# Patient Record
Sex: Male | Born: 2000 | Race: White | Hispanic: No | Marital: Married | Smoking: Never smoker
Health system: Southern US, Community
[De-identification: ages and names within clinical notes are randomized; demographics above are authoritative.]

## PROBLEM LIST (undated history)

## (undated) VITALS — BP 102/57 | HR 58 | Temp 97.6°F | Resp 18 | Ht 65.16 in | Wt 205.0 lb

## (undated) DIAGNOSIS — J45909 Unspecified asthma, uncomplicated: Secondary | ICD-10-CM

## (undated) DIAGNOSIS — F32A Depression, unspecified: Secondary | ICD-10-CM

## (undated) DIAGNOSIS — F419 Anxiety disorder, unspecified: Secondary | ICD-10-CM

## (undated) DIAGNOSIS — F909 Attention-deficit hyperactivity disorder, unspecified type: Secondary | ICD-10-CM

## (undated) DIAGNOSIS — F39 Unspecified mood [affective] disorder: Secondary | ICD-10-CM

## (undated) DIAGNOSIS — F329 Major depressive disorder, single episode, unspecified: Secondary | ICD-10-CM

## (undated) HISTORY — PX: OTHER SURGICAL HISTORY: SHX169

---
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
## 2019-05-02 ENCOUNTER — Ambulatory Visit (HOSPITAL_COMMUNITY)
Admission: RE | Admit: 2019-05-02 | Discharge: 2019-05-02 | Disposition: A | Discharge status: Home / Self Care | Payer: Medicaid Other | Attending: Psychiatry | Admitting: Psychiatry

## 2019-05-02 DIAGNOSIS — F909 Attention-deficit hyperactivity disorder, unspecified type: Secondary | ICD-10-CM | POA: Diagnosis not present

## 2019-05-02 DIAGNOSIS — F332 Major depressive disorder, recurrent severe without psychotic features: Secondary | ICD-10-CM | POA: Diagnosis not present

## 2019-05-02 DIAGNOSIS — F419 Anxiety disorder, unspecified: Secondary | ICD-10-CM | POA: Diagnosis not present

## 2019-05-02 DIAGNOSIS — J45909 Unspecified asthma, uncomplicated: Secondary | ICD-10-CM | POA: Insufficient documentation

## 2019-05-02 DIAGNOSIS — Z888 Allergy status to other drugs, medicaments and biological substances status: Secondary | ICD-10-CM | POA: Diagnosis not present

## 2019-05-02 NOTE — BH Assessment (Signed)
Assessment Note  Adrian Meyer is an 18 y.o. male [facility] after self-report he called the police reporting he needed mental health services. Patient report he feelings depressed triggered by a recent breakup with his girlfriend of 343-months. Patient denied suicidal ideations but report he has had passive thoughts, denied intent. Denied homicidal ideations and denied auditory / visual hallucinations. Report he wants to someone to talk to about his depression. Patient age-out of the program and therapy services he was receiving. Patient denies psychiatrist or OPT treatment, expressing he wishes to be connected. Report smoking marijuana and viping.  Patient affect was depressed and anxiety symptoms present as patient rocked back and forth during assessment. Patient report he lives with family in the area and he has someone he can talk to. Patient report history of depression, Bi-polar and anxiety. [facility] Patient denied additional hospitalizations since he being released. Report decreased sleep and appetite due to depression.   Disposition:   Denzil MagnusonLashunda Thomas, NP, patient psych-cleared. Does not meet inpatient criteria at this time.       Diagnosis:  F33.2   Major depressive disorder, Recurrent episode, Severe  Past Medical History:  Past Medical History:  Diagnosis Date  . ADHD (attention deficit hyperactivity disorder)   . Anxiety   . Asthma   . Depression   . Mood disorder [facility](HCC)     Past Surgical History:  Procedure Laterality Date  . wart removal      Family History: No family history on file.  Social History:  reports that he has never smoked. His smokeless tobacco use includes chew. He reports current drug use. Frequency: 4.00 times per week. Drug: Marijuana. He reports that he does not drink alcohol.  Additional Social History:  Alcohol / Drug Use Pain Medications: see MAR Prescriptions: see MAR Over the Counter: see  MAR History of alcohol / drug use?: Yes Substance #1 Name of Substance 1: THC 1 - Amount (size/oz): varies 1 - Frequency: daily 1 - Last Use / Amount: 05/01/2019  CIWA: CIWA-Ar BP: 122/69 Pulse Rate: 65 COWS:    Allergies:  Allergies  Allergen Reactions  . Depakote [Valproic Acid] Nausea Only    Pt reports Depakote makes him sick    Home Medications: (Not in a hospital admission)   OB/GYN Status:  No LMP for male patient.  General Assessment Data [facility] of Assessment: [facility] Services(walk-in) TTS Assessment: In system Is this a Tele or Face-to-Face Assessment?: Face-to-Face Is this an Initial Assessment or a Re-assessment for this encounter?: Initial Assessment Patient Accompanied by:: N/A(alone) Language Other than English: No Living Arrangements: Other (Comment)(report lives with family members ) What gender do you identify as?: Male Marital status: Single Maiden name: n/a Pregnancy Status: No Living Arrangements: Other relatives(report lives with family members ) Can pt return to current living arrangement?: Yes Admission Status: Voluntary Is patient capable of signing voluntary admission?: Yes Referral Source: Self/Family/Friend Insurance type: self-pay   Medical Screening Exam [facility] Walk-in ONLY) Medical Exam completed: Yes  Crisis Care Plan Living Arrangements: Other relatives(report lives with family members ) Legal Guardian: Other:(self ) Name of Psychiatrist: none report  Name of Therapist: none report   Education Status Is patient currently in school?: No Is the patient employed, unemployed or receiving disability?: Unemployed  Risk to self with the past 6 months Suicidal Ideation: No(currently denied SI, report passive thoughts ) Has patient been a risk to self within the past 6 months  prior to admission? : No Suicidal Intent: No Has patient had any suicidal intent within the past 6 months prior to admission? : No Is patient at risk for  suicide?: No Suicidal Plan?: No Has patient had any suicidal plan within the past 6 months prior to admission? : No Access to Means: No What has been your use of drugs/alcohol within the last 12 months?: THC  Previous Attempts/Gestures: No How many times?: 0 Other Self Harm Risks: none report  Triggers for Past Attempts: None known Intentional Self Injurious Behavior: None(None report ) Family Suicide History: No Recent stressful life event(s): Other (Comment)(recent breakup with girfriend ) Persecutory voices/beliefs?: No Depression: Yes Depression Symptoms: Feeling worthless/self pity Substance abuse history and/or treatment for substance abuse?: No Suicide prevention information given to non-admitted patients: Not applicable  Risk to Others within the past 6 months Homicidal Ideation: No Does patient have any lifetime risk of violence toward others beyond the six months prior to admission? : No Thoughts of Harm to Others: No Current Homicidal Intent: No Current Homicidal Plan: No Access to Homicidal Means: No Identified Victim: n/a History of harm to others?: No Assessment of Violence: None Noted Violent Behavior Description: None Noted  Does patient have access to weapons?: No Criminal Charges Pending?: No Does patient have a court date: No Is patient on probation?: No  Psychosis Hallucinations: None noted Delusions: None noted  Mental Status Report Appearance/Hygiene: Other (Comment)(appropriate for weather ) Eye Contact: Fair Motor Activity: Freedom of movement Speech: Logical/coherent Level of Consciousness: Alert Mood: Depressed Affect: Depressed Anxiety Level: None Thought Processes: Coherent, Relevant Judgement: Unimpaired Orientation: Person, Place, Time, Situation Obsessive Compulsive Thoughts/Behaviors: None  Cognitive Functioning Concentration: Good Memory: Recent Intact, Remote Intact Is patient IDD: No Insight: Fair Impulse Control:  Fair Appetite: Poor Have you had any weight changes? : No Change Sleep: Decreased Total Hours of Sleep: 6(report normally sleeps more than 6-hours ) Vegetative Symptoms: None  ADLScreening [facility] Assessment Services) Patient's cognitive ability adequate to safely complete daily activities?: Yes Patient able to express need for assistance with ADLs?: Yes Independently performs ADLs?: Yes (appropriate for developmental age)  Prior Inpatient Therapy Prior Inpatient Therapy: Yes Prior Therapy Dates: 08/2016&02/2017 [facility]  Reason for Treatment: mental health   Prior Outpatient Therapy Prior Outpatient Therapy: No Does patient have an ACCT team?: No Does patient have Intensive In-House Services?  : No Does patient have [facility] services? : No Does patient have P4CC services?: No  ADL Screening (condition at time of admission) Patient's cognitive ability adequate to safely complete daily activities?: Yes Is the patient deaf or have difficulty hearing?: No Does the patient have difficulty seeing, even when wearing glasses/contacts?: No Does the patient have difficulty concentrating, remembering, or making decisions?: No Patient able to express need for assistance with ADLs?: Yes Does the patient have difficulty dressing or bathing?: No Independently performs ADLs?: Yes (appropriate for developmental age) Does the patient have difficulty walking or climbing stairs?: No       Abuse/Neglect Assessment (Assessment to be complete while patient is alone) Abuse/Neglect Assessment Can Be Completed: Yes Physical Abuse: Denies Verbal Abuse: Denies Sexual Abuse: Denies Exploitation of patient/patient's resources: Denies Self-Neglect: Denies     Merchant navy officerAdvance Directives (For Healthcare) Does Patient Have a Medical Advance Directive?: No Would patient like information on creating a medical advance directive?: No - Patient declined           Disposition:  Disposition Initial Assessment Completed for  this Encounter: Kathrene Bongo, NP, patient psych-cleared ) Disposition of Patient: Discharge(Lashunda Marcello Moores, NP, pt psych-cleared )  On Site Evaluation by:   Reviewed with Physician:    Despina Hidden 05/02/2019 2:31 PM

## 2019-05-02 NOTE — H&P (Signed)
Behavioral Health Medical Screening Exam  Adrian Meyer is an 18 y.o. male.who presented to [facility] as a walk-in, voluntarily. He states that he has been feeling depressed lately due to a recent break-up with his girlfriend. He denies suicidal thoughts with plan or intent, homicidal ideas, or psychosis. He has prior psychiatric hospitalizations although int he distant past. He denies recent self harming behaviors. He is currently on medications to manage diagnoses of Bipolar, depression although reports he has no current therapist or psychiatrist. He reports he would like to get established with both. He reports smoking marijuana and vaping. He denies other substance abuse or use.    Total Time spent with patient: 15 minutes  Psychiatric Specialty Exam: Physical Exam  Vitals reviewed. Constitutional: He is oriented to person, place, and time.  Neurological: He is alert and oriented to person, place, and time.    Review of Systems  Psychiatric/Behavioral: Positive for depression. Negative for hallucinations, memory loss and suicidal ideas. Substance abuse: marijuana use  The patient is not nervous/anxious and does not have insomnia.   All other systems reviewed and are negative.   There were no vitals taken for this visit.There is no height or weight on file to calculate BMI.  General Appearance: Fairly Groomed  Eye Contact:  Good  Speech:  Clear and Coherent and Slow  Volume:  Decreased  Mood:  Depressed  Affect:  Congruent  Thought Process:  Coherent, Goal Directed, Linear and Descriptions of Associations: Intact  Orientation:  Full (Time, Place, and Person)  Thought Content:  Logical  Suicidal Thoughts:  No  Homicidal Thoughts:  No  Memory:  Immediate;   Fair Recent;   Fair  Judgement:  Fair  Insight:  Fair  Psychomotor Activity:  Normal  Concentration: Concentration: Fair and Attention Span: Fair  Recall:  [employer] of Knowledge:Fair  Language: Good  Akathisia:   Negative  Handed:  Right  AIMS (if indicated):     Assets:  Communication Skills Desire for Improvement Resilience Social Support  Sleep:       Musculoskeletal: Strength & Muscle Tone: within normal limits Gait & Station: normal Patient leans: N/A  There were no vitals taken for this visit.  Recommendations:  Based on my evaluation the patient does not appear to have an emergency medical condition.   No evidence of imminent risk to self or others at present.   Patient does not meet criteria for psychiatric inpatient admission. Resources provided for outpatient psychiatry and therapy/      Mordecai Maes, NP 05/02/2019, 1:41 PM

## 2019-05-12 ENCOUNTER — Inpatient Hospital Stay (HOSPITAL_COMMUNITY)
Admission: AD | Admit: 2019-05-12 | Discharge: 2019-05-15 | DRG: 885 | Disposition: A | Discharge status: Home / Self Care | Payer: Medicaid Other | Source: Other Acute Inpatient Hospital | Attending: Psychiatry | Admitting: Psychiatry

## 2019-05-12 ENCOUNTER — Other Ambulatory Visit: Payer: Self-pay

## 2019-05-12 ENCOUNTER — Encounter (HOSPITAL_COMMUNITY): Payer: Self-pay

## 2019-05-12 DIAGNOSIS — F332 Major depressive disorder, recurrent severe without psychotic features: Secondary | ICD-10-CM | POA: Diagnosis present

## 2019-05-12 DIAGNOSIS — F319 Bipolar disorder, unspecified: Secondary | ICD-10-CM | POA: Diagnosis present

## 2019-05-12 DIAGNOSIS — R45851 Suicidal ideations: Secondary | ICD-10-CM | POA: Diagnosis present

## 2019-05-12 DIAGNOSIS — F331 Major depressive disorder, recurrent, moderate: Secondary | ICD-10-CM | POA: Diagnosis not present

## 2019-05-12 DIAGNOSIS — F339 Major depressive disorder, recurrent, unspecified: Secondary | ICD-10-CM | POA: Diagnosis present

## 2019-05-12 MED ORDER — MAGNESIUM HYDROXIDE 400 MG/5ML PO SUSP: 30.0000 mL | Freq: Every day | ORAL | Status: DC | PRN

## 2019-05-12 MED ORDER — HYDROXYZINE HCL 25 MG PO TABS
25.0000 mg | ORAL_TABLET | Freq: Three times a day (TID) | ORAL | Status: DC | PRN
  Administered 2019-05-12 – 2019-05-14 (×3): 25 mg via ORAL
  Filled 2019-05-12 (×2): qty 1
  Filled 2019-05-12: qty 10
  Filled 2019-05-12: qty 1

## 2019-05-12 MED ORDER — ALUM & MAG HYDROXIDE-SIMETH 200-200-20 MG/5ML PO SUSP
30.0000 mL | ORAL | Status: DC | PRN
  Administered 2019-05-13: 30 mL via ORAL
  Filled 2019-05-12: qty 30

## 2019-05-12 MED ORDER — TRAZODONE HCL 50 MG PO TABS
50.0000 mg | ORAL_TABLET | Freq: Every evening | ORAL | Status: DC | PRN
  Administered 2019-05-12 – 2019-05-14 (×2): 50 mg via ORAL
  Filled 2019-05-12: qty 1
  Filled 2019-05-12: qty 7
  Filled 2019-05-12: qty 1

## 2019-05-12 MED ORDER — ACETAMINOPHEN 325 MG PO TABS: 650.0000 mg | ORAL_TABLET | Freq: Four times a day (QID) | ORAL | Status: DC | PRN

## 2019-05-12 NOTE — Tx Team (Signed)
Initial Treatment Plan 05/12/2019 8:57 PM [name] AXE:[id number]    PATIENT STRESSORS: Marital or family conflict Medication change or noncompliance   PATIENT STRENGTHS: Active sense of humor Physical Health Special hobby/interest Supportive family/friends   PATIENT IDENTIFIED PROBLEMS: "Depression"   "Broke up with girlfriend"   "At risk for suicide"                  DISCHARGE CRITERIA:  Ability to meet basic life and health needs Improved stabilization in mood, thinking, and/or behavior Verbal commitment to aftercare and medication compliance  PRELIMINARY DISCHARGE PLAN: Attend PHP/IOP Outpatient therapy Participate in family therapy Return to previous living arrangement  PATIENT/FAMILY INVOLVEMENT: This treatment plan has been presented to and reviewed with the patient, Adrian Meyer. The patient have been given the opportunity to ask questions and make suggestions.  Lonia Skinner, RN 05/12/2019, 8:57 PM
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
## 2019-05-12 NOTE — Progress Notes (Signed)
[place] NOVEL CORONAVIRUS (COVID-19) DAILY CHECK-OFF SYMPTOMS - answer yes or no to each - every day NO YES  Have you had a fever in the past 24 hours?  . Fever (Temp > 37.80C / 100F) X   Have you had any of these symptoms in the past 24 hours? . New Cough .  Sore Throat  .  Shortness of Breath .  Difficulty Breathing .  Unexplained Body Aches   X   Have you had any one of these symptoms in the past 24 hours not related to allergies?   . Runny Nose .  Nasal Congestion .  Sneezing   X   If you have had runny nose, nasal congestion, sneezing in the past 24 hours, has it worsened?  X   EXPOSURES - check yes or no X   Have you traveled outside the state in the past 14 days?  X   Have you been in contact with someone with a confirmed diagnosis of COVID-19 or PUI in the past 14 days without wearing appropriate PPE?  X   Have you been living in the same home as a person with confirmed diagnosis of COVID-19 or a PUI (household contact)?    X   Have you been diagnosed with COVID-19?    X              What to do next: Answered NO to all: Answered YES to anything:   Proceed with unit schedule Follow the [facility] Inpatient Flowsheet.   

## 2019-05-13 DIAGNOSIS — F331 Major depressive disorder, recurrent, moderate: Secondary | ICD-10-CM

## 2019-05-13 MED ORDER — NICOTINE POLACRILEX 2 MG MT GUM
2.0000 mg | CHEWING_GUM | OROMUCOSAL | Status: DC | PRN
  Administered 2019-05-13 – 2019-05-15 (×3): 2 mg via ORAL
  Filled 2019-05-13 (×2): qty 1
  Filled 2019-05-13: qty 10

## 2019-05-13 MED ORDER — CARIPRAZINE HCL 1.5 MG PO CAPS
4.5000 mg | ORAL_CAPSULE | Freq: Every day | ORAL | Status: DC
  Administered 2019-05-13 – 2019-05-15 (×3): 4.5 mg via ORAL
  Filled 2019-05-13 (×3): qty 3
  Filled 2019-05-13: qty 21
  Filled 2019-05-13: qty 3

## 2019-05-13 MED ORDER — PRAZOSIN HCL 1 MG PO CAPS
1.0000 mg | ORAL_CAPSULE | Freq: Every day | ORAL | Status: DC
  Administered 2019-05-13 – 2019-05-14 (×2): 1 mg via ORAL
  Filled 2019-05-13: qty 1
  Filled 2019-05-13: qty 7
  Filled 2019-05-13 (×3): qty 1

## 2019-05-13 MED ORDER — CLONAZEPAM 1 MG PO TABS
1.0000 mg | ORAL_TABLET | Freq: Every day | ORAL | Status: DC
  Administered 2019-05-13 – 2019-05-14 (×2): 1 mg via ORAL
  Filled 2019-05-13 (×2): qty 1

## 2019-05-13 NOTE — [facility] Suicide Risk Assessment (Signed)
[place] INPATIENT:  Family/Significant Other Suicide Prevention Education  Suicide Prevention Education:  Education Completed; [place] (pt's mother, [phone]) has been identified by the patient as the family member/significant other with whom the patient will be residing, and identified as the person(s) who will aid the patient in the event of a mental health crisis (suicidal ideations/suicide attempt).  With written consent from the patient, the family member/significant other has been provided the following suicide prevention education, prior to the and/or following the discharge of the patient.  The suicide prevention education provided includes the following:  Suicide risk factors  Suicide prevention and interventions  National Suicide Hotline telephone number  [place] assessment telephone number  [facility] Emergency Assistance [place] and/or Residential Mobile Crisis Unit telephone number  Request made of family/significant other to:  Remove weapons (e.g., guns, rifles, knives), all items previously/currently identified as safety concern.    Remove drugs/medications (over-the-counter, prescriptions, illicit drugs), all items previously/currently identified as a safety concern.  The family member/significant other verbalizes understanding of the suicide prevention education information provided.  The family member/significant other agrees to remove the items of safety concern listed above.  Pt's mother reports that pt is his own legal guardian. Pt's mother states that pt appears to be sounding better and less depressed on the phone with her. She reports that pt follows up with [facility] for medication management in [place] and that [facility] has been working on getting pt set up with in home therapy services. She reports that all guns in the home are locked in safe and that pt does not have access (uncle's safe).  She states that pt and the rest of the family struggle with transportation to appointments and wanted CSW to know this.   Avelina Laine LCSW 05/13/2019, 4:50 PM
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
## 2019-05-13 NOTE — Plan of Care (Signed)
  Problem: Education: Goal: Knowledge of [facility] Education information/materials will improve Outcome: Progressing Goal: Emotional status will improve Outcome: Progressing Goal: Mental status will improve Outcome: Progressing Goal: Verbalization of understanding the information provided will improve Outcome: Progressing   Problem: Activity: Goal: Interest or engagement in activities will improve Outcome: Progressing   

## 2019-05-13 NOTE — Progress Notes (Signed)
Per pt and his mother, pt is his own legal guardian Adrian Meyer has been deactivated). Pt's mother provided [facility] Coordinator information: Ms Thurmond Butts [phone] and reports that the care coordinator is working to "arrange an in home team or in home therapist for [place]." CSW to follow-up with aftercare coordination on Monday.  Blyss Lugar S. Ouida Sills, MSW, LCSW Clinical Social Worker 05/13/2019 4:50 PM

## 2019-05-13 NOTE — Progress Notes (Signed)
Adult Psychoeducational Group Note  Date:  05/13/2019 Time:  11:10 PM  Group Topic/Focus:  Wrap-Up Group:   The focus of this group is to help patients review their daily goal of treatment and discuss progress on daily workbooks.  Participation Level:  Active  Participation Quality:  Appropriate  Affect:  Appropriate  Cognitive:  Appropriate  Insight: Appropriate  Engagement in Group:  Engaged  Modes of Intervention:  Discussion  Additional Comments:  Pt stated his goal was to work on anger management.  Pt stated he did achieve his goal.  One of the things he learned to work on his anger is to stop, count and watch your tone before reacting.  Pt rated the day at a 8/10.   Oceana Walthall 05/13/2019, 11:10 PM

## 2019-05-14 NOTE — Plan of Care (Signed)
Nurse discussed anxiety, depression and coping skills with patient.  

## 2019-05-14 NOTE — Progress Notes (Signed)
Writer spoke with patient 1:1 and he reports that he has anger issues and is working on that while he is here. Writer informed him of his scheduled medications and he asked about medication to help with him feeling a little bit anxious. He received vistaril to help with his anxiety. He was very polite and appreciative. Safety maintained with 15 min checks. Will monitor effectiveness of medication given.

## 2019-05-14 NOTE — Progress Notes (Signed)
D:  Patient denied SI and HI.  Denied A/V hallucinations.  Denied pain. A;   Medications administered per MD orders.  Emotional support and encouragement given patient. R:  Safety maintained with 15 minute checks.                                                                                                                                                                                                                                                                                                                                                                                                                      

## 2019-05-14 NOTE — [facility] Group Notes (Signed)
[facility] Group Notes:  (Nursing/MHT/Case Management/Adjunct)  Date:  05/14/2019  Time:  1330  Type of Therapy:  Nurse Education  Participation Level:  Active  Participation Quality:  Appropriate  Affect:  Appropriate  Cognitive:  Appropriate  Insight:  Appropriate  Engagement in Group:  Engaged  Modes of Intervention:  Activity  Summary of Progress/Problems:Pt shared about feeling depressed and angry. Pt requested coping skills on depression and anger.   Jillisa Harris L 05/14/2019, 2:51 PM
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
## 2019-05-14 NOTE — Progress Notes (Signed)
[facility]  05/14/2019 9:34 AM Adrian Meyer  MRN:  [id number] Subjective:  Adrian Meyer reported " I am feeling better today I do not want to die."  Evaluation: Adrian Meyer observed resting in bed.  He presents pleasant, calm and cooperative.  Denying suicidal or homicidal ideations.  Denies auditory or visual hallucinations. Cites a recent break-up with a girlfriend which caused worsening depression and suicidal thoughts. "  I do not feel that way now ma'am". Adrian Meyer rates his depression 0 out of 10 with 10 being the worst.  Reports a good appetite.  States he is resting well throughout the night.  Reports attending group sessions on yesterday.  Reports he is hopeful to discharge soon.  Reports taking and tolerating medications well. Support, encouragement and reassurance was provided.   Principal Problem: MDD (major depressive disorder), recurrent episode (HCC) Diagnosis: Principal Problem:   MDD (major depressive disorder), recurrent episode (HCC) Active Problems:   MDD (major depressive disorder), recurrent severe, without psychosis (HCC)  Total Time spent with patient: 15 minutes  Past Psychiatric History:  Past Medical History:  Past Medical History:  Diagnosis Date  . ADHD (attention deficit hyperactivity disorder)   . Anxiety   . Asthma   . Depression   . Mood disorder [facility](HCC)     Past Surgical History:  Procedure Laterality Date  . wart removal     Family History: History reviewed. No pertinent family history. Family Psychiatric  History:  Social History:  Social History   Substance and Sexual Activity  Alcohol Use No     Social History   Substance and Sexual Activity  Drug Use Not Currently  . Frequency: 4.0 times per week  . Types: Marijuana   Comment: "1 blunt a day"     Social History   Socioeconomic History  . Marital status: Single    Spouse name: Not on file  . Number of children: Not on file  . Years of education: Not on file  . Highest  education level: Not on file  Occupational History  . Not on file  Social Needs  . Financial resource strain: Not on file  . Food insecurity    Worry: Not on file    Inability: Not on file  . Transportation needs    Medical: Not on file    Non-medical: Not on file  Tobacco Use  . Smoking status: Never Smoker  . Smokeless tobacco: Current User    Types: Chew  Substance and Sexual Activity  . Alcohol use: No  . Drug use: Not Currently    Frequency: 4.0 times per week    Types: Marijuana    Comment: "1 blunt a day"   . Sexual activity: Never    Birth control/protection: None  Lifestyle  . Physical activity    Days per week: Not on file    Minutes per session: Not on file  . Stress: Not on file  Relationships  . Social Musicianconnections    Talks on phone: Not on file    Gets together: Not on file    Attends religious service: Not on file    Active member of club or organization: Not on file    Attends meetings of clubs or organizations: Not on file    Relationship status: Not on file  Other Topics Concern  . Not on file  Social History Narrative  . Not on file   Additional Social History:    Pain Medications: Denies abuse Prescriptions:  Denies abuse Over the Counter: Denies abuse History of alcohol / drug use?: Yes Longest period of sobriety (when/how long): Unknown Name of Substance 1: Marijuana 1 - Age of First Use: Unknown 1 - Amount (size/oz): Unknown 1 - Frequency: Unknown 1 - Duration: Unknown 1 - Last Use / Amount: Unknown                  Sleep: Good  Appetite:  Fair  Current Medications: Current Facility-Administered Medications  Medication Dose Route Frequency Provider Last Rate Last Dose  . acetaminophen (TYLENOL) tablet 650 mg  650 mg Oral Q6H PRN Laveda AbbeParks, Laurie Britton, NP      . alum & mag hydroxide-simeth (MAALOX/MYLANTA) 200-200-20 MG/5ML suspension 30 mL  30 mL Oral Q4H PRN Laveda AbbeParks, Laurie Britton, NP   30 mL at 05/13/19 0949  . cariprazine  (VRAYLAR) capsule 4.5 mg  4.5 mg Oral Daily Antonieta Pertlary, Greg Lawson, MD   4.5 mg at 05/14/19 0744  . clonazePAM (KLONOPIN) tablet 1 mg  1 mg Oral QHS Antonieta Pertlary, Greg Lawson, MD   1 mg at 05/13/19 2109  . hydrOXYzine (ATARAX/VISTARIL) tablet 25 mg  25 mg Oral TID PRN Laveda AbbeParks, Laurie Britton, NP   25 mg at 05/13/19 1957  . magnesium hydroxide (MILK OF MAGNESIA) suspension 30 mL  30 mL Oral Daily PRN Laveda AbbeParks, Laurie Britton, NP      . nicotine polacrilex (NICORETTE) gum 2 mg  2 mg Oral PRN Oneta RackLewis, Jordane Hisle N, NP   2 mg at 05/13/19 0949  . prazosin (MINIPRESS) capsule 1 mg  1 mg Oral QHS Antonieta Pertlary, Greg Lawson, MD   1 mg at 05/13/19 2109  . traZODone (DESYREL) tablet 50 mg  50 mg Oral QHS PRN Laveda AbbeParks, Laurie Britton, NP   50 mg at 05/12/19 2110    Lab Results: No results found for this or any previous visit (from the past 48 hour(s)).  Blood Alcohol level:  Lab Results  Component Value Date   ETH <5 11/05/2016   ETH <5 07/29/2015    Metabolic Disorder Labs: Lab Results  Component Value Date   HGBA1C 5.0 03/20/2017   MPG 97 03/20/2017   MPG 103 09/05/2016   Lab Results  Component Value Date   PROLACTIN 8.8 09/05/2016   Lab Results  Component Value Date   CHOL 131 03/20/2017   TRIG 110 03/20/2017   HDL 30 (L) 03/20/2017   CHOLHDL 4.4 03/20/2017   VLDL 22 03/20/2017   LDLCALC 79 03/20/2017   LDLCALC 79 09/05/2016    Physical Findings: AIMS: Facial and Oral Movements Muscles of Facial Expression: None, normal Lips and Perioral Area: None, normal Jaw: None, normal Tongue: None, normal,Extremity Movements Upper (arms, wrists, hands, fingers): None, normal Lower (legs, knees, ankles, toes): None, normal, Trunk Movements Neck, shoulders, hips: None, normal, Overall Severity Severity of abnormal movements (highest score from questions above): None, normal Incapacitation due to abnormal movements: None, normal Patient's awareness of abnormal movements (rate only patient's report): No Awareness, Dental  Status Current problems with teeth and/or dentures?: No Does patient usually wear dentures?: No  CIWA:    COWS:     Musculoskeletal: Strength & Muscle Tone: within normal limits Gait & Station: normal Patient leans: N/A  Psychiatric Specialty Exam: Physical Exam  Vitals reviewed. Constitutional: He appears well-developed.  Psychiatric: He has a normal mood and affect. His behavior is normal.    Review of Systems  Psychiatric/Behavioral: Positive for depression. Negative for suicidal ideas. The patient is nervous/anxious.  All other systems reviewed and are negative.   Blood pressure 111/67, pulse 74, temperature 97.6 F (36.4 C), temperature source Oral, resp. rate 18, height 5\' 7"  (1.702 m), weight 83.7 kg, SpO2 100 %.Body mass index is 28.9 kg/m.  General Appearance: Casual  Eye Contact:  Good  Speech:  Clear and Coherent  Volume:  Normal  Mood:  Anxious  Affect:  Appropriate  Thought Process:  Coherent  Orientation:  Full (Time, Place, and Person)  Thought Content:  Logical  Suicidal Thoughts:  No  Homicidal Thoughts:  No  Memory:  Immediate;   Fair Remote;   Fair  Judgement:  Fair  Insight:  Fair  Psychomotor Activity:  Normal  Concentration:  Concentration: Fair  Recall:  [employer] of Knowledge:  Fair  Language:  Fair  Akathisia:  No  Handed:  Right  AIMS (if indicated):     Assets:  Communication Skills Desire for Improvement Resilience Social Support  ADL's:  Intact  Cognition:  WNL  Sleep:  Number of Hours: 6.5     Treatment Plan Summary: Daily contact with patient to assess and evaluate symptoms and progress in treatment and Medication management   Continue with current treatment plan on 05/14/2019 as listed below except were noted  Mood stabilization medication  Continue Vraylar 4.5 mg p.o. daily Continue Klonopin 1 mg p.o. nightly Continue Minipress 1 mg p.o. nightly Continue trazodone 50 mg p.o. nightly as needed  CSW to continue  working on discharge disposition Patient encouraged to participate within the therapeutic milieu  Derrill [place], NP 05/14/2019, 9:34 AM

## 2019-05-15 MED ORDER — CLONAZEPAM 0.5 MG PO TABS: 0.5000 mg | ORAL_TABLET | Freq: Every evening | ORAL | 0 refills | Status: AC | PRN

## 2019-05-15 MED ORDER — CARIPRAZINE HCL 4.5 MG PO CAPS: 4.5000 mg | ORAL_CAPSULE | Freq: Every day | ORAL | 0 refills | Status: DC

## 2019-05-15 MED ORDER — NICOTINE POLACRILEX 2 MG MT GUM: 2.0000 mg | CHEWING_GUM | OROMUCOSAL | 0 refills | Status: DC | PRN

## 2019-05-15 MED ORDER — PRAZOSIN HCL 1 MG PO CAPS: 1.0000 mg | ORAL_CAPSULE | Freq: Every day | ORAL | 0 refills | Status: AC

## 2019-05-15 MED ORDER — HYDROXYZINE HCL 25 MG PO TABS: 25.0000 mg | ORAL_TABLET | Freq: Three times a day (TID) | ORAL | 0 refills | Status: AC | PRN

## 2019-05-15 NOTE — Care Management (Signed)
CMA spoke with patient's Care Coordinator Cory Roughen at [phone]. Per Michiel Cowboy she will talk with the patient after discharge to set him up with in home therapy services.   CMA has notified CSW, Radonna Ricker.     Lilley Hubble Care Management Assistant  Email:[email or web address] Office: [phone]
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
## 2019-05-15 NOTE — Progress Notes (Signed)
  [facility] Adult Case Management Discharge Plan :  Will you be returning to the same living situation after discharge:  No. Patient reports he is discharging home with his sister. At discharge, do you have transportation home?: Yes,  patient's sister is picking him up Do you have the ability to pay for your medications: Yes,  Medicaid  Release of information consent forms completed and in the chart;  Patient's signature needed at discharge.  Patient to Follow up at: Follow-up [place] Follow up on 05/22/2019.   Why: Medication management appointment is Monday 8/24 at 3:50p.  Please bring your current medications and discharge paperwork from this hospitalization.  Contact information: [address] [place] [address] Ph: [phone] Fx: [phone]       Cory Roughen Follow up.   Why: Your Care Coordinator will help you find in home therapy services.  Isbael will contact you after discharge.  Contact information: Ph: [phone]          Next level of care provider has access to [facility] and Suicide Prevention discussed: Yes,  with the patient's mother  Have you used any form of tobacco in the last 30 days? (Cigarettes, Smokeless Tobacco, Cigars, and/or Pipes): Yes  Has patient been referred to the Quitline?: Patient refused referral  Patient has been referred for addiction treatment: N/A  Marylee Floras, [place] 05/15/2019, 10:16 AM

## 2019-05-15 NOTE — BHH Suicide Risk Assessment (Signed)
[facility] Discharge Suicide Risk Assessment   Principal Problem: MDD (major depressive disorder), recurrent episode ([place]) Discharge Diagnoses: Principal Problem:   MDD (major depressive disorder), recurrent episode ([place]) Active Problems:   MDD (major depressive disorder), recurrent severe, without psychosis ([place])   Total Time spent with patient: 15 minutes  Musculoskeletal: Strength & Muscle Tone: within normal limits Gait & Station: normal Patient leans: N/A  Psychiatric Specialty Exam: Review of Systems  All other systems reviewed and are negative.   Blood pressure 117/77, pulse (!) 52, temperature 98.5 F (36.9 C), temperature source Oral, resp. rate 18, height 5\' 7"  (1.702 m), weight 83.7 kg, SpO2 100 %.Body mass index is 28.9 kg/m.  General Appearance: Casual  Eye Contact::  Fair  Speech:  Normal Rate409  Volume:  Normal  Mood:  Euthymic  Affect:  Congruent  Thought Process:  Coherent and Descriptions of Associations: Intact  Orientation:  Full (Time, Place, and Person)  Thought Content:  Logical  Suicidal Thoughts:  No  Homicidal Thoughts:  No  Memory:  Immediate;   Fair Recent;   Fair Remote;   Fair  Judgement:  Intact  Insight:  Fair  Psychomotor Activity:  Increased  Concentration:  Fair  Recall:  [employer] of Knowledge:Fair  Language: Fair  Akathisia:  Negative  Handed:  Right  AIMS (if indicated):     Assets:  Desire for Improvement Housing Resilience  Sleep:  Number of Hours: 6.5  Cognition: Impaired,  Mild  ADL's:  Intact   Mental Status Per Nursing Assessment::   On Admission:  Suicidal ideation indicated by patient, Suicide plan  Demographic Factors:  Male, Adolescent or young adult, Caucasian, Low socioeconomic status and Unemployed  Loss Factors: Loss of significant relationship  Historical Factors: Impulsivity  Risk Reduction Factors:   Sense of responsibility to family and Positive social support  Continued Clinical Symptoms:   Severe Anxiety and/or Agitation Bipolar Disorder:   Depressive phase Depression:   Impulsivity  Cognitive Features That Contribute To Risk:  None    Suicide Risk:  Minimal: No identifiable suicidal ideation.  Patients presenting with no risk factors but with morbid ruminations; may be classified as minimal risk based on the severity of the depressive symptoms  Follow-up [place] Follow up.   Why: med mgmt appt needed. PLEASE CONTACT [place] CARE COORDINATOR? MS. Thurmond Butts [phone] REGARDING FOLLOW-UP PLAN FOR THERAPY/IN HOME SERVICES FOR PT. (PER MOM AND PT'S REQUEST) Contact information: [place]---2 locations. needs to know which one patient goes to for med mgmt.           Plan Of Care/Follow-up recommendations:  Activity:  ad lib  Sharma Covert, MD 05/15/2019, 8:17 AM

## 2019-05-15 NOTE — Tx Team (Signed)
Interdisciplinary Treatment and Diagnostic Plan Update  05/15/2019 Time of Session:  Adrian Meyer MRN: [id number]  Principal Diagnosis: MDD (major depressive disorder), recurrent episode ([place])  Secondary Diagnoses: Principal Problem:   MDD (major depressive disorder), recurrent episode ([place]) Active Problems:   MDD (major depressive disorder), recurrent severe, without psychosis ([place])   Current Medications:  Current Facility-Administered Medications  Medication Dose Route Frequency Provider Last Rate Last Dose  . acetaminophen (TYLENOL) tablet 650 mg  650 mg Oral Q6H PRN Ethelene Hal, NP      . alum & mag hydroxide-simeth (MAALOX/MYLANTA) 200-200-20 MG/5ML suspension 30 mL  30 mL Oral Q4H PRN Ethelene Hal, NP   30 mL at 05/13/19 0949  . cariprazine (VRAYLAR) capsule 4.5 mg  4.5 mg Oral Daily Sharma Covert, MD   4.5 mg at 05/15/19 0731  . clonazePAM (KLONOPIN) tablet 1 mg  1 mg Oral QHS Sharma Covert, MD   1 mg at 05/14/19 2103  . hydrOXYzine (ATARAX/VISTARIL) tablet 25 mg  25 mg Oral TID PRN Ethelene Hal, NP   25 mg at 05/14/19 1802  . magnesium hydroxide (MILK OF MAGNESIA) suspension 30 mL  30 mL Oral Daily PRN Ethelene Hal, NP      . nicotine polacrilex (NICORETTE) gum 2 mg  2 mg Oral PRN Derrill Center, NP   2 mg at 05/15/19 0735  . prazosin (MINIPRESS) capsule 1 mg  1 mg Oral QHS Sharma Covert, MD   1 mg at 05/14/19 2103  . traZODone (DESYREL) tablet 50 mg  50 mg Oral QHS PRN Ethelene Hal, NP   50 mg at 05/14/19 2103   PTA Medications: Medications Prior to Admission  Medication Sig Dispense Refill Last Dose  . VRAYLAR 4.5 MG CAPS Take 1 capsule by mouth daily.     . [DISCONTINUED] clonazePAM (KLONOPIN) 0.5 MG tablet Take 1 tablet by mouth at bedtime as needed.       Patient Stressors: Marital or family conflict Medication change or noncompliance  Patient Strengths: Active sense of humor Physical  Health Special hobby/interest Supportive family/friends  Treatment Modalities: Medication Management, Group therapy, Case management,  1 to 1 session with clinician, Psychoeducation, Recreational therapy.   Physician Treatment Plan for Primary Diagnosis: MDD (major depressive disorder), recurrent episode ([place]) Long Term Goal(s): Improvement in symptoms so as ready for discharge Improvement in symptoms so as ready for discharge   Short Term Goals: Ability to identify changes in lifestyle to reduce recurrence of condition will improve Ability to verbalize feelings will improve Ability to disclose and discuss suicidal ideas Ability to demonstrate self-control will improve Ability to identify and develop effective coping behaviors will improve Ability to maintain clinical measurements within normal limits will improve Compliance with prescribed medications will improve  Medication Management: Evaluate patient's response, side effects, and tolerance of medication regimen.  Therapeutic Interventions: 1 to 1 sessions, Unit Group sessions and Medication administration.  Evaluation of Outcomes: Adequate for Discharge  Physician Treatment Plan for Secondary Diagnosis: Principal Problem:   MDD (major depressive disorder), recurrent episode ([place]) Active Problems:   MDD (major depressive disorder), recurrent severe, without psychosis ([place])  Long Term Goal(s): Improvement in symptoms so as ready for discharge Improvement in symptoms so as ready for discharge   Short Term Goals: Ability to identify changes in lifestyle to reduce recurrence of condition will improve Ability to verbalize feelings will improve Ability to disclose and discuss suicidal ideas Ability to demonstrate self-control  will improve Ability to identify and develop effective coping behaviors will improve Ability to maintain clinical measurements within normal limits will improve Compliance with prescribed medications will  improve     Medication Management: Evaluate patient's response, side effects, and tolerance of medication regimen.  Therapeutic Interventions: 1 to 1 sessions, Unit Group sessions and Medication administration.  Evaluation of Outcomes: Adequate for Discharge   RN Treatment Plan for Primary Diagnosis: MDD (major depressive disorder), recurrent episode ([facility]) Long Term Goal(s): Knowledge of disease and therapeutic regimen to maintain health will improve  Short Term Goals: Ability to participate in decision making will improve, Ability to verbalize feelings will improve, Ability to disclose and discuss suicidal ideas and Ability to identify and develop effective coping behaviors will improve  Medication Management: RN will administer medications as ordered by provider, will assess and evaluate patient's response and provide education to patient for prescribed medication. RN will report any adverse and/or side effects to prescribing provider.  Therapeutic Interventions: 1 on 1 counseling sessions, Psychoeducation, Medication administration, Evaluate responses to treatment, Monitor vital signs and CBGs as ordered, Perform/monitor CIWA, COWS, AIMS and Fall Risk screenings as ordered, Perform wound care treatments as ordered.  Evaluation of Outcomes: Adequate for Discharge   LCSW Treatment Plan for Primary Diagnosis: MDD (major depressive disorder), recurrent episode (HCC) Long Term Goal(s): Safe transition to appropriate next level of care at discharge, Engage patient in therapeutic group addressing interpersonal concerns.  Short Term Goals: Engage patient in aftercare planning with referrals and resources  Therapeutic Interventions: Assess for all discharge needs, 1 to 1 time with Social worker, Explore available resources and support systems, Assess for adequacy in community support network, Educate family and significant other(s) on suicide prevention, Complete Psychosocial Assessment,  Interpersonal group therapy.  Evaluation of Outcomes: Adequate for Discharge   Progress in Treatment: Attending groups: Yes. Participating in groups: Yes. Taking medication as prescribed: Yes. Toleration medication: Yes. Family/Significant other contact made: Yes, individual(s) contacted:  the patient's mother Patient understands diagnosis: Yes. Discussing patient identified problems/goals with staff: Yes. Medical problems stabilized or resolved: Yes. Denies suicidal/homicidal ideation: Yes. Issues/concerns per patient self-inventory: No. Other:  New problem(s) identified:  New Short Term/Long Term Goal(s):Detox, medication stabilization, elimination of SI thoughts, development of comprehensive mental wellness plan.     Patient Goals:  "I learned how to deal with depression and how to get through it"   Discharge Plan or Barriers: Patient is discharging home with his sister. He will continue to continue to follow up with [facility] for medication management. His [facility] Care Coordinator is arranging for in home therapy services at discharge.   Reason for Continuation of Hospitalization: none   Estimated Length of Stay: Discharged, 05/15/2019  Attendees: Patient: Adrian Meyer 05/15/2019 11:44 AM  Physician: Dr. Landry MellowGreg Clary, MD 05/15/2019 11:44 AM  Nursing: Casimiro NeedleMichael. Kathie RhodesS, RN 05/15/2019 11:44 AM  RN Care Manager: 05/15/2019 11:44 AM  Social Worker: Adrian Meyer, Adrian Meyer 05/15/2019 11:44 AM  Recreational Therapist:  05/15/2019 11:44 AM  Other:  05/15/2019 11:44 AM  Other:  05/15/2019 11:44 AM  Other: 05/15/2019 11:44 AM    Scribe for Treatment Team: Adrian Meyer, Adrian Meyer 05/15/2019 11:44 AM

## 2019-05-15 NOTE — Plan of Care (Signed)
Discharge note  Patient verbalizes readiness for discharge. Follow up plan explained, AVS, Transition record and SRA given. Prescriptions and teaching provided. Belongings returned and signed for. Suicide safety plan completed and signed. Patient verbalizes understanding. Patient denies SI/HI and assures this Probation officer he will seek assistance should that change. Patient discharged to lobby where sister was waiting.  Problem: Education: Goal: Knowledge of [place] [facility] Education information/materials will improve Outcome: Adequate for Discharge Goal: Emotional status will improve Outcome: Adequate for Discharge Goal: Mental status will improve Outcome: Adequate for Discharge Goal: Verbalization of understanding the information provided will improve Outcome: Adequate for Discharge   Problem: Activity: Goal: Interest or engagement in activities will improve Outcome: Adequate for Discharge Goal: Sleeping patterns will improve Outcome: Adequate for Discharge   Problem: Coping: Goal: Ability to verbalize frustrations and anger appropriately will improve Outcome: Adequate for Discharge Goal: Ability to demonstrate self-control will improve Outcome: Adequate for Discharge   Problem: Health Behavior/Discharge Planning: Goal: Identification of resources available to assist in meeting health care needs will improve Outcome: Adequate for Discharge Goal: Compliance with treatment plan for underlying cause of condition will improve Outcome: Adequate for Discharge   Problem: Physical Regulation: Goal: Ability to maintain clinical measurements within normal limits will improve Outcome: Adequate for Discharge   Problem: Safety: Goal: Periods of time without injury will increase Outcome: Adequate for Discharge   Problem: Education: Goal: Ability to make informed decisions regarding treatment will improve Outcome: Adequate for Discharge   Problem: Coping: Goal: Coping ability will  improve Outcome: Adequate for Discharge   Problem: Health Behavior/Discharge Planning: Goal: Identification of resources available to assist in meeting health care needs will improve Outcome: Adequate for Discharge   Problem: Medication: Goal: Compliance with prescribed medication regimen will improve Outcome: Adequate for Discharge   Problem: Self-Concept: Goal: Ability to disclose and discuss suicidal ideas will improve Outcome: Adequate for Discharge Goal: Will verbalize positive feelings about self Outcome: Adequate for Discharge   Problem: Education: Goal: Ability to incorporate positive changes in behavior to improve self-esteem will improve Outcome: Adequate for Discharge   Problem: Health Behavior/Discharge Planning: Goal: Ability to identify and utilize available resources and services will improve Outcome: Adequate for Discharge Goal: Ability to remain free from injury will improve Outcome: Adequate for Discharge   Problem: Self-Concept: Goal: Will verbalize positive feelings about self Outcome: Adequate for Discharge   Problem: Skin Integrity: Goal: Demonstration of wound healing without infection will improve Outcome: Adequate for Discharge   Problem: Education: Goal: Ability to state activities that reduce stress will improve Outcome: Adequate for Discharge   Problem: Coping: Goal: Ability to identify and develop effective coping behavior will improve Outcome: Adequate for Discharge   Problem: Self-Concept: Goal: Ability to identify factors that promote anxiety will improve Outcome: Adequate for Discharge Goal: Level of anxiety will decrease Outcome: Adequate for Discharge Goal: Ability to modify response to factors that promote anxiety will improve Outcome: Adequate for Discharge

## 2019-05-15 NOTE — Progress Notes (Signed)
Recreation Therapy Notes  Date:  8.17.20 Time: 0930 Location: [address] Dayroom  Group Topic: Stress Management  Goal Area(s) Addresses:  Patient will identify positive stress management techniques. Patient will identify benefits of using stress management post d/c.  Behavioral Response:  Engaged  Intervention:  Stress Management  Activity :  Meditation.  LRT introduced the stress management technique of meditation.  LRT played a meditation that focused on taking on the characteristics of a mountain during meditation and in life.  Patients were to listen and follow along as meditation played.  Education:  Stress Management, Discharge Planning.   Education Outcome: Acknowledges Education  Clinical Observations/Feedback: Pt attended and participated in group.     Zaiah Credeur, [name]         Savera Donson A 05/15/2019 11:36 AM 

## 2020-03-09 ENCOUNTER — Emergency Department (HOSPITAL_COMMUNITY)
Admission: EM | Admit: 2020-03-09 | Discharge: 2020-03-12 | Disposition: A | Discharge status: Home / Self Care | Payer: Medicaid Other | Attending: Emergency Medicine | Admitting: Emergency Medicine

## 2020-03-09 ENCOUNTER — Other Ambulatory Visit: Payer: Self-pay

## 2020-03-09 ENCOUNTER — Encounter (HOSPITAL_COMMUNITY): Payer: Self-pay

## 2020-03-09 DIAGNOSIS — R45851 Suicidal ideations: Secondary | ICD-10-CM

## 2020-03-09 DIAGNOSIS — F17220 Nicotine dependence, chewing tobacco, uncomplicated: Secondary | ICD-10-CM | POA: Insufficient documentation

## 2020-03-09 DIAGNOSIS — Z79899 Other long term (current) drug therapy: Secondary | ICD-10-CM | POA: Diagnosis not present

## 2020-03-09 DIAGNOSIS — F3163 Bipolar disorder, current episode mixed, severe, without psychotic features: Secondary | ICD-10-CM | POA: Diagnosis not present

## 2020-03-09 DIAGNOSIS — Z20822 Contact with and (suspected) exposure to covid-19: Secondary | ICD-10-CM | POA: Insufficient documentation

## 2020-03-09 DIAGNOSIS — R456 Violent behavior: Secondary | ICD-10-CM | POA: Diagnosis present

## 2020-03-09 DIAGNOSIS — J45909 Unspecified asthma, uncomplicated: Secondary | ICD-10-CM | POA: Insufficient documentation

## 2020-03-09 DIAGNOSIS — R4689 Other symptoms and signs involving appearance and behavior: Secondary | ICD-10-CM

## 2020-03-09 LAB — CBC
HCT: 46.8 % (ref 39.0–52.0)
Hemoglobin: 15.9 g/dL (ref 13.0–17.0)
MCH: 30.9 pg (ref 26.0–34.0)
MCHC: 34 g/dL (ref 30.0–36.0)
MCV: 91.1 fL (ref 80.0–100.0)
Platelets: 190 10*3/uL (ref 150–400)
RBC: 5.14 MIL/uL (ref 4.22–5.81)
RDW: 12.8 % (ref 11.5–15.5)
WBC: 8.2 10*3/uL (ref 4.0–10.5)
nRBC: 0 % (ref 0.0–0.2)

## 2020-03-09 LAB — COMPREHENSIVE METABOLIC PANEL
ALT: 37 U/L (ref 0–44)
AST: 28 U/L (ref 15–41)
Albumin: 4.8 g/dL (ref 3.5–5.0)
Alkaline Phosphatase: 77 U/L (ref 38–126)
Anion gap: 14 (ref 5–15)
BUN: 12 mg/dL (ref 6–20)
CO2: 23 mmol/L (ref 22–32)
Calcium: 9.5 mg/dL (ref 8.9–10.3)
Chloride: 104 mmol/L (ref 98–111)
Creatinine, Ser: 0.99 mg/dL (ref 0.61–1.24)
GFR calc Af Amer: >60 mL/min (ref >60)
GFR calc non Af Amer: >60 mL/min (ref >60)
Glucose, Bld: 104 mg/dL — ABNORMAL HIGH (ref 70–99)
Potassium: 4 mmol/L (ref 3.5–5.1)
Sodium: 141 mmol/L (ref 135–145)
Total Bilirubin: 0.9 mg/dL (ref 0.3–1.2)
Total Protein: 7.7 g/dL (ref 6.5–8.1)

## 2020-03-09 LAB — RAPID URINE DRUG SCREEN, HOSP PERFORMED
Amphetamines: NOT DETECTED
Barbiturates: NOT DETECTED
Benzodiazepines: POSITIVE — AB
Cocaine: NOT DETECTED
Opiates: NOT DETECTED
Tetrahydrocannabinol: POSITIVE — AB

## 2020-03-09 LAB — ACETAMINOPHEN LEVEL: Acetaminophen (Tylenol), Serum: <10 ug/mL — ABNORMAL LOW (ref 10–30)

## 2020-03-09 LAB — ETHANOL: Alcohol, Ethyl (B): <10 mg/dL (ref <10)

## 2020-03-09 LAB — SALICYLATE LEVEL: Salicylate Lvl: <7 mg/dL — ABNORMAL LOW (ref 7.0–30.0)

## 2020-03-09 MED ORDER — LORAZEPAM 2 MG/ML IJ SOLN
2.0000 mg | Freq: Once | INTRAMUSCULAR | Status: AC
  Administered 2020-03-09: 2 mg via INTRAMUSCULAR
  Filled 2020-03-09: qty 1

## 2020-03-09 MED ORDER — CARIPRAZINE HCL 1.5 MG PO CAPS: 4.5000 mg | ORAL_CAPSULE | Freq: Every day | ORAL | Status: DC

## 2020-03-09 MED ORDER — PRAZOSIN HCL 1 MG PO CAPS
1.0000 mg | ORAL_CAPSULE | Freq: Every day | ORAL | Status: DC
  Administered 2020-03-10: 1 mg via ORAL
  Filled 2020-03-09 (×3): qty 1

## 2020-03-09 MED ORDER — CLONAZEPAM 0.5 MG PO TABS
0.5000 mg | ORAL_TABLET | Freq: Every evening | ORAL | Status: DC | PRN
  Administered 2020-03-10 – 2020-03-11 (×2): 0.5 mg via ORAL
  Filled 2020-03-09 (×3): qty 1

## 2020-03-09 MED ORDER — ZIPRASIDONE MESYLATE 20 MG IM SOLR
10.0000 mg | Freq: Once | INTRAMUSCULAR | Status: DC
  Filled 2020-03-09: qty 20

## 2020-03-09 MED ORDER — ZIPRASIDONE MESYLATE 20 MG IM SOLR
20.0000 mg | Freq: Once | INTRAMUSCULAR | Status: AC
  Administered 2020-03-09: 20 mg via INTRAMUSCULAR

## 2020-03-09 MED ORDER — HYDROXYZINE HCL 25 MG PO TABS
25.0000 mg | ORAL_TABLET | Freq: Three times a day (TID) | ORAL | Status: DC | PRN
  Administered 2020-03-10 – 2020-03-12 (×4): 25 mg via ORAL
  Filled 2020-03-09 (×4): qty 1

## 2020-03-09 NOTE — ED Triage Notes (Addendum)
Arrived with [employer], but not in custody. patien's family called 911 because patient was physically aggressive toward family members and himself. Patient has come voluntarily to ED today, but [employer] reports they believe family is trying to get patient IVC'd. Patient calm and cooperative on arrival to this facility. Patient states: He got upset today and started to hurt himself because he is heartbroken because  "I told the girl I love that I hate her". Patient states his life is worthless and he does not deserve to live. Patient also endorses SI and HI but "that was earlier today, not right this second".

## 2020-03-09 NOTE — ED Provider Notes (Signed)
[facility] DEPT Provider Note   CSN: [id number] Arrival date & time: 03/09/20  1821     History No chief complaint on file.   Adrian Meyer is a 19 y.o. male.  HPI   62yM brought in by police after family called because of aggressive behavior. Pt says he told someone he cared about that he hated them. He regretted saying this and became emotionally upset that he did this. Says he took a knife and wanted to kill himself. It sounds like family tried to intervene and he began arguing with them. He says he often has thoughts of wanting to die. Doesn't take all his psychiatric medications because he doesn't like the way they make him feel. Only takes vistaril and klonopin. Uses marijuana. Denies drugs or alcohol otherwise.   Past Medical History:  Diagnosis Date  . ADHD (attention deficit hyperactivity disorder)   . Anxiety   . Asthma   . Depression   . Mood disorder [facility])     Patient Active Problem List   Diagnosis Date Noted  . MDD (major depressive disorder), recurrent severe, without psychosis ([place]) 05/12/2019  . MDD (major depressive disorder), recurrent episode ([place]) 03/19/2017  . Bipolar 1 disorder, depressed, severe ([place]) 09/03/2016  . Bipolar disorder, curr episode mixed, severe, w/o psychotic features ([place]) 07/31/2015    Past Surgical History:  Procedure Laterality Date  . wart removal         History reviewed. No pertinent family history.  Social History   Tobacco Use  . Smoking status: Never Smoker  . Smokeless tobacco: Current User    Types: Chew  Substance Use Topics  . Alcohol use: No  . Drug use: Not Currently    Frequency: 4.0 times per week    Types: Marijuana    Comment: "1 blunt a day"     Home Medications Prior to Admission medications   Medication Sig Start Date End Date Taking? Authorizing Provider  cariprazine 4.5 MG CAPS Take 1 capsule (4.5 mg total) by mouth daily. 05/16/19   Connye Burkitt, NP    clonazePAM (KLONOPIN) 0.5 MG tablet Take 1 tablet (0.5 mg total) by mouth at bedtime as needed. 05/15/19   Connye Burkitt, NP  hydrOXYzine (ATARAX/VISTARIL) 25 MG tablet Take 1 tablet (25 mg total) by mouth 3 (three) times daily as needed for anxiety. 05/15/19   Connye Burkitt, NP  nicotine polacrilex (NICORETTE) 2 MG gum Take 1 each (2 mg total) by mouth as needed for smoking cessation. 05/15/19   Connye Burkitt, NP  prazosin (MINIPRESS) 1 MG capsule Take 1 capsule (1 mg total) by mouth at bedtime. 05/15/19   Connye Burkitt, NP    Allergies    Depakote [valproic acid]  Review of Systems   Review of Systems All systems reviewed and negative, other than as noted in HPI.  Physical Exam Updated Vital Signs BP 108/62 (BP Location: Right Arm)   Pulse 88   Temp 98.2 F (36.8 C) (Oral)   Resp 20   Ht 5\' 7"  (1.702 m)   Wt 86.2 kg   SpO2 99%   BMI 29.76 kg/m   Physical Exam Vitals and nursing note reviewed.  Constitutional:      General: He is not in acute distress.    Appearance: He is well-developed.  HENT:     Head: Normocephalic and atraumatic.  Eyes:     General:        Right  eye: No discharge.        Left eye: No discharge.     Conjunctiva/sclera: Conjunctivae normal.  Cardiovascular:     Rate and Rhythm: Normal rate and regular rhythm.     Heart sounds: Normal heart sounds. No murmur heard.  No friction rub. No gallop.   Pulmonary:     Effort: Pulmonary effort is normal. No respiratory distress.     Breath sounds: Normal breath sounds.  Abdominal:     General: There is no distension.     Palpations: Abdomen is soft.     Tenderness: There is no abdominal tenderness.  Musculoskeletal:        General: No tenderness.     Cervical back: Neck supple.  Skin:    General: Skin is warm and dry.  Neurological:     Mental Status: He is alert.  Psychiatric:     Comments: Labile. Calm and cooperative initially but then coming extremely agitated. Poor insight.      ED  Results / Procedures / Treatments   Labs (all labs ordered are listed, but only abnormal results are displayed) Labs Reviewed  COMPREHENSIVE METABOLIC PANEL - Abnormal; Notable for the following components:      Result Value   Glucose, Bld 104 (*)    All other components within normal limits  SALICYLATE LEVEL - Abnormal; Notable for the following components:   Salicylate Lvl <7.0 (*)    All other components within normal limits  ACETAMINOPHEN LEVEL - Abnormal; Notable for the following components:   Acetaminophen (Tylenol), Serum <10 (*)    All other components within normal limits  RAPID URINE DRUG SCREEN, HOSP PERFORMED - Abnormal; Notable for the following components:   Benzodiazepines POSITIVE (*)    Tetrahydrocannabinol POSITIVE (*)    All other components within normal limits  SARS CORONAVIRUS 2 BY RT PCR (HOSPITAL ORDER, PERFORMED IN [place] [facility])  ETHANOL  CBC    EKG None  Radiology No results found.  Procedures Procedures (including critical care time)  Medications Ordered in ED Medications - No data to display  ED Course  I have reviewed the triage vital signs and the nursing notes.  Pertinent labs & imaging results that were available during my care of the patient were reviewed by me and considered in my medical decision making (see chart for details).    MDM Rules/Calculators/A&P                         19yM with aggressive behavior. Very poor impulse control. Was initially reasonably calm and cooperative. He acknowledged that he needed help. When explaining the plan though he became irate. Kicked a garbage can down [employer]. He was then very apologetic and sat down. He said he would cooperate if we gave him a diet Coke. By the someone could go get him one the police had to restrain him because he was yelling again in the hall and trying to leave. Chemically restrained. Medically cleared at this point for TTS evaluation.   Final Clinical  Impression(s) / ED Diagnoses Final diagnoses:  Aggressive behavior  Suicidal thoughts    Rx / DC Orders ED Discharge Orders    None       Raeford Razor, MD 03/09/20 2313

## 2020-03-09 NOTE — ED Notes (Signed)
Pt kicked the trash can after the EDP [place] told him that he could not go home. Pt became aggressive with [employer] after they would not allow him to leave. [employer] assisted pt to the ground in order to prevent pt from harming himself and others. Pt continued to threaten staff with physical violence  Pt was handcuffed by [employer] and moved from [place] A to Rm 3

## 2020-03-09 NOTE — ED Notes (Signed)
Pt attempted to leave ED and was then handcuffed and escorted back to hall bed. Pt then lowered himself to the floor, and was noncompliant with all requests. Pt was also began threatening and becoming physically aggressive with staff while handcuffed. Pt was then placed in a bed, and violent restraints applied for pt and staff safety. It was explained to the pt that cooperation and safe behavior were the criteria for release. Pts needs were also assessed and met when restrained such as toileting, privacy, and nutrition.  

## 2020-03-10 LAB — SARS CORONAVIRUS 2 BY RT PCR (HOSPITAL ORDER, PERFORMED IN ~~LOC~~ HOSPITAL LAB): SARS Coronavirus 2: NEGATIVE

## 2020-03-10 MED ORDER — OXCARBAZEPINE 300 MG PO TABS
300.0000 mg | ORAL_TABLET | Freq: Two times a day (BID) | ORAL | Status: DC
  Administered 2020-03-10 (×2): 300 mg via ORAL
  Filled 2020-03-10 (×2): qty 1

## 2020-03-10 MED ORDER — NICOTINE 14 MG/24HR TD PT24
14.0000 mg | MEDICATED_PATCH | Freq: Once | TRANSDERMAL | Status: AC
  Administered 2020-03-10: 14 mg via TRANSDERMAL
  Filled 2020-03-10: qty 1

## 2020-03-10 MED ORDER — QUETIAPINE FUMARATE 100 MG PO TABS
100.0000 mg | ORAL_TABLET | Freq: Every day | ORAL | Status: DC
  Administered 2020-03-10: 100 mg via ORAL
  Filled 2020-03-10: qty 1

## 2020-03-10 NOTE — BH Assessment (Signed)
Received TTS consult request. Per RN, Pt is currently medicated and somnolent. TTS will complete assessment when Pt is able to participate.   Pamalee Leyden, [facility], [facility] Triage Specialist [phone]

## 2020-03-10 NOTE — Progress Notes (Signed)
Per Molli Knock DNP, patient meets criteria for inpatient treatment. There are no appropriate beds available at [facility] per [facility]. Therefore, CSW faxed referrals to the following facilities for review:  [facility]  TTS will continue to seek bed placement.   Trula Slade, MSW, LCSW Clinical Social Worker 03/10/2020 11:41 AM

## 2020-03-11 DIAGNOSIS — F3163 Bipolar disorder, current episode mixed, severe, without psychotic features: Secondary | ICD-10-CM

## 2020-03-11 MED ORDER — OXCARBAZEPINE 300 MG PO TABS
450.0000 mg | ORAL_TABLET | Freq: Two times a day (BID) | ORAL | Status: DC
  Administered 2020-03-11 – 2020-03-12 (×3): 450 mg via ORAL
  Filled 2020-03-11 (×3): qty 1

## 2020-03-11 MED ORDER — OLANZAPINE 5 MG PO TABS
7.5000 mg | ORAL_TABLET | Freq: Every day | ORAL | Status: DC
  Administered 2020-03-11: 7.5 mg via ORAL
  Filled 2020-03-11: qty 1

## 2020-03-11 MED ORDER — OLANZAPINE 2.5 MG PO TABS
2.5000 mg | ORAL_TABLET | Freq: Every day | ORAL | Status: DC
  Administered 2020-03-11 – 2020-03-12 (×2): 2.5 mg via ORAL
  Filled 2020-03-11 (×2): qty 1

## 2020-03-11 NOTE — ED Notes (Signed)
Pt is being calm and cooperative today. Insight is poor. Behavior is appropriate.

## 2020-03-11 NOTE — ED Notes (Signed)
Pharmacy contacted to verify Minipress medication. States they will tube it shortly, will continue to monitor.

## 2020-03-11 NOTE — ED Notes (Signed)
Medication was placed in the wrong patient specific bin. Patient is asleep after requesting sleeping medication. Will administer when pt wakes up.
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
## 2020-03-12 MED ORDER — OLANZAPINE 7.5 MG PO TABS: 7.5000 mg | ORAL_TABLET | Freq: Every day | ORAL | 0 refills | Status: AC

## 2020-03-12 MED ORDER — OXCARBAZEPINE 150 MG PO TABS: 450.0000 mg | ORAL_TABLET | Freq: Two times a day (BID) | ORAL | 0 refills | Status: AC

## 2020-03-12 MED ORDER — OLANZAPINE 2.5 MG PO TABS: 2.5000 mg | ORAL_TABLET | Freq: Every day | ORAL | 0 refills | Status: AC

## 2020-03-12 NOTE — Discharge Instructions (Addendum)
For your behavioral health needs, you are advised to follow up with [facility].  You are scheduled for the following VIRTUAL intake appointments:  INTAKE:      Monday, March 25, 2020 at 1:00 pm  PSYCHIATRY:      Thursday, March 28, 2020 at 1:00 pm       [facility]      [address]      [place], [place] [address]      [phone]

## 2020-03-12 NOTE — BHH Suicide Risk Assessment (Cosign Needed)
Suicide Risk Assessment  Discharge Assessment   [facility] Discharge Suicide Risk Assessment   Principal Problem: Bipolar disorder, curr episode mixed, severe, w/o psychotic features ([place]) Discharge Diagnoses: Principal Problem:   Bipolar disorder, curr episode mixed, severe, w/o psychotic features ([place])   Total Time spent with patient: 30 minutes  Musculoskeletal: Strength & Muscle Tone: within normal limits Gait & Station: normal Patient leans: N/A  Psychiatric Specialty Exam:   Blood pressure 119/77, pulse 63, temperature 98.4 F (36.9 C), temperature source Oral, resp. rate 15, height 5\' 7"  (1.702 m), weight 86.2 kg, SpO2 97 %.Body mass index is 29.76 kg/m.  General Appearance: Casual  Eye Contact::  Good  Speech:  Clear and Coherent and Normal Rate  Volume:  Normal  Mood:  "Better"   Affect:  Appropriate and Congruent  Thought Process:  Coherent and Goal Directed  Orientation:  Full (Time, Place, and Person)  Thought Content:  WDL  Suicidal Thoughts:  No  Homicidal Thoughts:  No  Memory:  Immediate;   Good Recent;   Good  Judgement:  Intact  Insight:  Present  Psychomotor Activity:  Normal  Concentration:  Good  Recall:  Good  Fund of Knowledge:Fair  Language: Good  Akathisia:  No  Handed:  Right  AIMS (if indicated):     Assets:  Communication Skills Desire for Improvement Housing Social Support  Sleep:     Cognition: WNL  ADL's:  Intact   Mental Status Per Nursing Assessment::   On Admission:    Adrian Meyer, 19 y.o., male patient seen face to face by this provider, consulted with Dr. Dwyane Dee; and chart reviewed on 03/12/20.  On evaluation Adrian Meyer reports he is feeling better and that he needs help with anger management.  States that he has made statements about hurting himself or other but never acted on it.  "I just get mad so easy."  Patient gave permission to speak to his mother for collateral information.  TTS will speak with  mother.   During evaluation Adrian Meyer is alert/oriented x 4; calm/cooperative; and mood is congruent with affect.  He does not appear to be responding to internal/external stimuli or delusional thoughts.  Patient denies suicidal/self-harm/homicidal ideation, psychosis, and paranoia.  Patient answered question appropriately.    Demographic Factors:  Male and Caucasian  Loss Factors: NA  Historical Factors: Impulsivity  Risk Reduction Factors:   Religious beliefs about death, Living with another person, especially a relative and Positive social support  Continued Clinical Symptoms:  Previous Psychiatric Diagnoses and Treatments  Cognitive Features That Contribute To Risk:  None    Suicide Risk:  Minimal: No identifiable suicidal ideation.  Patients presenting with no risk factors but with morbid ruminations; may be classified as minimal risk based on the severity of the depressive symptoms    Plan Of Care/Follow-up recommendations:  Activity:  As tolerated Diet:  Heart helathy     Discharge Instructions     For your behavioral health needs, you are advised to follow up with [facility].  You are scheduled for the following VIRTUAL intake appointments:  INTAKE:      Monday, March 25, 2020 at 1:00 pm  PSYCHIATRY:      Thursday, March 28, 2020 at 1:00 pm       [facility]      [place], [place] [address]      [phone]  Disposition:  Psychiatrically cleared No evidence of imminent risk to self or others at present.   Patient does not meet criteria for psychiatric inpatient admission. Supportive therapy provided about ongoing stressors. Discussed crisis plan, support from social network, calling 911, coming to the Emergency Department, and calling Suicide Hotline.  Adrian Janus, NP 03/12/2020, 2:57 PM

## 2020-03-12 NOTE — BH Assessment (Signed)
BHH Assessment Progress Note  Per Shuvon Rankin, FNP, this pt does not require psychiatric hospitalization at this time.  Pt presents under IVC initiated by EDP Raeford Razor, MD, which has been rescinded by Nelly Rout, MD.  Pt is to be discharged from [facility] with virtual appointments for outpatient follow up at [facility]  At 12:57 I called them and spoke to [facility].  She has scheduled pt for intake on Monday, 03/25/2020 at 13:00, and for psychiatry on Thursday, 03/28/2020 at 13:00.  These have been included in pt's discharge instructions.  Pt's nurse, Waynetta Sandy, has been notified.  Doylene Canning, MA Triage Specialist [phone]

## 2020-03-12 NOTE — ED Notes (Signed)
Pt DC d off unit to home per provider. Pt alert, cooperative, no s/s of distress. DC information given to and reviewed with pt, pt acknowledged understanding. Belongings given to pt. Pt ambulatory off the unit, escorted by [facility]. Pt transported by family.

## 2020-03-25 ENCOUNTER — Other Ambulatory Visit: Payer: Self-pay

## 2020-03-25 ENCOUNTER — Ambulatory Visit (HOSPITAL_COMMUNITY): Payer: Medicaid Other | Admitting: Licensed Clinical Social Worker

## 2020-03-28 ENCOUNTER — Other Ambulatory Visit: Payer: Self-pay

## 2020-03-28 ENCOUNTER — Telehealth (HOSPITAL_COMMUNITY): Payer: Medicaid Other | Admitting: Psychiatric/Mental Health

## 2020-05-27 MED FILL — Olanzapine Tab 2.5 MG: ORAL | Qty: 2.5 | Status: AC

## 2020-05-27 MED FILL — Hydroxyzine HCl Tab 25 MG: ORAL | Qty: 25 | Status: AC

## 2020-07-19 ENCOUNTER — Other Ambulatory Visit: Payer: Self-pay | Admitting: Physician Assistant

## 2020-07-19 DIAGNOSIS — S62316S Displaced fracture of base of fifth metacarpal bone, right hand, sequela: Secondary | ICD-10-CM

## 2020-12-31 ENCOUNTER — Ambulatory Visit (HOSPITAL_COMMUNITY): Payer: Medicaid Other | Admitting: Physician Assistant

## 2021-01-25 ENCOUNTER — Emergency Department (HOSPITAL_COMMUNITY)
Admission: EM | Admit: 2021-01-25 | Discharge: 2021-01-25 | Disposition: A | Discharge status: Home / Self Care | Payer: Medicaid Other | Attending: Emergency Medicine | Admitting: Emergency Medicine

## 2021-01-25 ENCOUNTER — Other Ambulatory Visit: Payer: Self-pay

## 2021-01-25 ENCOUNTER — Encounter (HOSPITAL_COMMUNITY): Payer: Self-pay | Admitting: Emergency Medicine

## 2021-01-25 DIAGNOSIS — J45909 Unspecified asthma, uncomplicated: Secondary | ICD-10-CM | POA: Diagnosis not present

## 2021-01-25 DIAGNOSIS — F32A Depression, unspecified: Secondary | ICD-10-CM | POA: Diagnosis not present

## 2021-01-25 DIAGNOSIS — F17290 Nicotine dependence, other tobacco product, uncomplicated: Secondary | ICD-10-CM | POA: Insufficient documentation

## 2021-01-25 DIAGNOSIS — F4321 Adjustment disorder with depressed mood: Secondary | ICD-10-CM

## 2021-01-25 NOTE — ED Provider Notes (Signed)
[facility] EMERGENCY DEPARTMENT Provider Note   CSN: [id number] Arrival date & time: 01/25/21  1029     History Chief Complaint  Patient presents with  . Depression    Adrian Meyer is a 20 y.o. male patient is feeling depressed, useless, and helpless.He is having grief due to the loss of his father this past January and  His girlfriend left him. He is compliant with medications. He wants a consistent therapist. He has no SI/HI or AVH.   HPI     Past Medical History:  Diagnosis Date  . ADHD (attention deficit hyperactivity disorder)   . Anxiety   . Asthma   . Depression   . Mood disorder [facility])     Patient Active Problem List   Diagnosis Date Noted  . Bipolar disorder, curr episode mixed, severe, w/o psychotic features (HCC) 07/31/2015    Past Surgical History:  Procedure Laterality Date  . wart removal         No family history on file.  Social History   Tobacco Use  . Smoking status: Never Smoker  . Smokeless tobacco: Current User    Types: Chew  Vaping Use  . Vaping Use: Every day  Substance Use Topics  . Alcohol use: No  . Drug use: Not Currently    Frequency: 4.0 times per week    Types: Marijuana    Comment: "1 blunt a day"     Home Medications Prior to Admission medications   Medication Sig Start Date End Date Taking? Authorizing Provider  clonazePAM (KLONOPIN) 0.5 MG tablet Take 1 tablet (0.5 mg total) by mouth at bedtime as needed. Patient not taking: Reported on 03/10/2020 05/15/19   Aldean Baker, NP  hydrOXYzine (ATARAX/VISTARIL) 25 MG tablet Take 1 tablet (25 mg total) by mouth 3 (three) times daily as needed for anxiety. Patient not taking: Reported on 03/11/2020 05/15/19   Aldean Baker, NP  OLANZapine (ZYPREXA) 2.5 MG tablet Take 1 tablet (2.5 mg total) by mouth daily. 03/13/20   Rankin, Shuvon B, NP  OLANZapine (ZYPREXA) 7.5 MG tablet Take 1 tablet (7.5 mg total) by mouth at bedtime. 03/12/20   Rankin,  Shuvon B, NP  OXcarbazepine (TRILEPTAL) 150 MG tablet Take 3 tablets (450 mg total) by mouth 2 (two) times daily. 03/12/20   Rankin, Shuvon B, NP  prazosin (MINIPRESS) 1 MG capsule Take 1 capsule (1 mg total) by mouth at bedtime. Patient not taking: Reported on 03/11/2020 05/15/19   Aldean Baker, NP    Allergies    Depakote [valproic acid]  Review of Systems   Review of Systems Ten systems reviewed and are negative for acute change, except as noted in the HPI.   Physical Exam Updated Vital Signs BP (!) 137/99 (BP Location: Left Arm)   Pulse 97   Temp 98 F (36.7 C) (Oral)   Resp 16   SpO2 99%   Physical Exam Vitals and nursing note reviewed.  Constitutional:      General: He is not in acute distress.    Appearance: He is well-developed. He is not diaphoretic.  HENT:     Head: Normocephalic and atraumatic.  Eyes:     General: No scleral icterus.    Conjunctiva/sclera: Conjunctivae normal.  Cardiovascular:     Rate and Rhythm: Normal rate and regular rhythm.     Heart sounds: Normal heart sounds.  Pulmonary:     Effort: Pulmonary effort is normal. No respiratory distress.  Breath sounds: Normal breath sounds.  Abdominal:     Palpations: Abdomen is soft.     Tenderness: There is no abdominal tenderness.  Musculoskeletal:     Cervical back: Normal range of motion and neck supple.  Skin:    General: Skin is warm and dry.  Neurological:     Mental Status: He is alert.  Psychiatric:        Behavior: Behavior normal.     ED Results / Procedures / Treatments   Labs (all labs ordered are listed, but only abnormal results are displayed) Labs Reviewed - No data to display  EKG None  Radiology No results found.  Procedures Procedures   Medications Ordered in ED Medications - No data to display  ED Course  I have reviewed the triage vital signs and the nursing notes.  Pertinent labs & imaging results that were available during my care of the patient were  reviewed by me and considered in my medical decision making (see chart for details).    MDM Rules/Calculators/A&P                          Patient with depression and grief. No emergent sxs including si, HI or pyschosis. Plan dispo with op resources.  Final Clinical Impression(s) / ED Diagnoses Final diagnoses:  None    Rx / DC Orders ED Discharge Orders    None       Arthor Captain, PA-C 01/25/21 1620    Rolan Bucco, MD 01/26/21 [phone]

## 2021-01-25 NOTE — Discharge Instructions (Signed)
Online therapy also available at  [email or web address]  Get help right away if: You have serious thoughts about hurting yourself or someone else.

## 2021-01-25 NOTE — ED Triage Notes (Signed)
Pt reports depression since his father passed away on 30-Oct-2020.  Denies SI/HI.

## 2021-02-20 ENCOUNTER — Ambulatory Visit (HOSPITAL_COMMUNITY): Payer: Medicaid Other | Admitting: Physician Assistant

## 2021-03-17 ENCOUNTER — Ambulatory Visit (HOSPITAL_COMMUNITY): Payer: Medicaid Other

## 2021-03-17 ENCOUNTER — Other Ambulatory Visit: Payer: Self-pay

## 2023-10-04 ENCOUNTER — Ambulatory Visit
Admission: EM | Admit: 2023-10-04 | Discharge: 2023-10-04 | Disposition: A | Discharge status: Home / Self Care | Payer: MEDICAID

## 2023-10-04 ENCOUNTER — Encounter: Payer: Self-pay | Admitting: *Deleted

## 2023-10-04 ENCOUNTER — Other Ambulatory Visit: Payer: Self-pay

## 2023-10-04 DIAGNOSIS — H66001 Acute suppurative otitis media without spontaneous rupture of ear drum, right ear: Secondary | ICD-10-CM

## 2023-10-04 MED ORDER — CEPHALEXIN 500 MG PO CAPS: 500.0000 mg | ORAL_CAPSULE | Freq: Three times a day (TID) | ORAL | 0 refills | Status: AC

## 2023-10-04 NOTE — ED Provider Notes (Signed)
 [facility]    CSN: [id number] Arrival date & time: 10/04/23  1623      History   Chief Complaint No chief complaint on file.   HPI Adrian Meyer is a 23 y.o. male.   HPI Patient presents today for evaluation of right ear pain.  He reports the pain became severe today.  He has experienced some right ear fullness and admits that he has been trying to any excess wax manually with his finger. Denies any drainage, fever or any URI symptoms. Past Medical History:  Diagnosis Date   ADHD (attention deficit hyperactivity disorder)    Anxiety    Asthma    Depression    Mood disorder [facility])     Patient Active Problem List   Diagnosis Date Noted   Bipolar disorder, curr episode mixed, severe, w/o psychotic features (HCC) 07/31/2015    Past Surgical History:  Procedure Laterality Date   wart removal         Home Medications    Prior to Admission medications   Medication Sig Start Date End Date Taking? Authorizing Provider  cephALEXin  (KEFLEX ) 500 MG capsule Take 1 capsule (500 mg total) by mouth 3 (three) times daily for 7 days. 10/04/23 10/11/23 Yes Arloa Suzen RAMAN, NP  clonazePAM  (KLONOPIN ) 2 MG tablet Take 2 mg by mouth daily as needed.   Yes [provider]  gabapentin  (NEURONTIN ) 300 MG capsule Take 300 mg by mouth 3 (three) times daily. 09/09/23  Yes [provider]  hydrOXYzine  (VISTARIL ) 50 MG capsule Take 50 mg by mouth 3 (three) times daily as needed.   Yes [provider]  zolpidem (AMBIEN) 5 MG tablet Take 5 mg by mouth at bedtime. 08/18/23  Yes [provider]  clonazePAM  (KLONOPIN ) 0.5 MG tablet Take 1 tablet (0.5 mg total) by mouth at bedtime as needed. Patient not taking: Reported on 03/10/2020 05/15/19   Wonda Clarita BRAVO, NP  hydrOXYzine  (ATARAX /VISTARIL ) 25 MG tablet Take 1 tablet (25 mg total) by mouth 3 (three) times daily as needed for anxiety. Patient not taking: Reported on 03/11/2020 05/15/19   Wonda Clarita BRAVO, NP  OLANZapine  (ZYPREXA ) 2.5 MG tablet Take 1 tablet (2.5 mg total) by mouth daily. Patient not taking: Reported on 10/04/2023 03/13/20   Rankin, Shuvon B, NP  OLANZapine  (ZYPREXA ) 7.5 MG tablet Take 1 tablet (7.5 mg total) by mouth at bedtime. 03/12/20   Rankin, Shuvon B, NP  OXcarbazepine  (TRILEPTAL ) 150 MG tablet Take 3 tablets (450 mg total) by mouth 2 (two) times daily. Patient not taking: Reported on 10/04/2023 03/12/20   Rankin, Shuvon B, NP  prazosin  (MINIPRESS ) 1 MG capsule Take 1 capsule (1 mg total) by mouth at bedtime. Patient not taking: Reported on 03/11/2020 05/15/19   Wonda Clarita BRAVO, NP    Family History No family history on file.  Social History Social History   Tobacco Use   Smoking status: Never   Smokeless tobacco: Current    Types: Snuff  Vaping Use   Vaping status: Never Used  Substance Use Topics   Alcohol use: No   Drug use: Yes    Frequency: 4.0 times per week    Types: Marijuana    Comment: 1 blunt a day      Allergies   Depakote [valproic acid]   Review of Systems Review of Systems Pertinent negatives listed in HPI   Physical Exam Triage Vital Signs ED Triage Vitals  Encounter Vitals Group  BP 10/04/23 1712 120/70     Systolic BP Percentile --      Diastolic BP Percentile --      Pulse Rate 10/04/23 1712 75     Resp 10/04/23 1712 18     Temp 10/04/23 1712 97.9 F (36.6 C)     Temp Source 10/04/23 1712 Oral     SpO2 10/04/23 1712 95 %     Weight --      Height --      Head Circumference --      Peak Flow --      Pain Score 10/04/23 1707 7     Pain Loc --      Pain Education --      Exclude from Growth Chart --    No data found.  Updated Vital Signs BP 120/70 (BP Location: Left Arm)   Pulse 75   Temp 97.9 F (36.6 C) (Oral)   Resp 18   SpO2 95%   Visual Acuity Right Eye Distance:   Left Eye Distance:   Bilateral Distance:    Right Eye Near:   Left Eye Near:    Bilateral Near:     Physical  Exam Constitutional:      Appearance: Normal appearance.  HENT:     Head: Normocephalic and atraumatic.     Right Ear: Decreased hearing noted. Swelling and tenderness present. Tympanic membrane is erythematous and bulging.     Left Ear: Hearing, ear canal and external ear normal.  Eyes:     Extraocular Movements: Extraocular movements intact.     Pupils: Pupils are equal, round, and reactive to light.  Cardiovascular:     Rate and Rhythm: Normal rate and regular rhythm.  Pulmonary:     Effort: Pulmonary effort is normal.     Breath sounds: Normal breath sounds.  Skin:    General: Skin is warm.  Neurological:     General: No focal deficit present.     Mental Status: He is alert.      UC Treatments / Results  Labs (all labs ordered are listed, but only abnormal results are displayed) Labs Reviewed - No data to display  EKG   Radiology No results found.  Procedures Procedures (including critical care time)  Medications Ordered in UC Medications - No data to display  Initial Impression / Assessment and Plan / UC Course  I have reviewed the triage vital signs and the nursing notes.  Pertinent labs & imaging results that were available during my care of the patient were reviewed by me and considered in my medical decision making (see chart for details).    Recurrent otitis media involving the right ear, treatment with Keflex  500 mg 3 times daily x 7 days.  Tylenol  and Ibuprofen  as needed for pain.  Return precautions given if symptoms worsen or do not improve. Final Clinical Impressions(s) / UC Diagnoses   Final diagnoses:  Non-recurrent acute suppurative otitis media of right ear without spontaneous rupture of tympanic membrane   Discharge Instructions   None    ED Prescriptions     Medication Sig Dispense Auth. Provider   cephALEXin  (KEFLEX ) 500 MG capsule Take 1 capsule (500 mg total) by mouth 3 (three) times daily for 7 days. 21 capsule Arloa Suzen RAMAN,  NP      PDMP not reviewed this encounter.   Arloa Suzen RAMAN, NP 10/05/23 2010

## 2023-10-04 NOTE — ED Triage Notes (Signed)
 Right ear pain today. Recently got over a cold

## 2024-04-08 ENCOUNTER — Other Ambulatory Visit: Payer: Self-pay

## 2024-04-08 ENCOUNTER — Emergency Department (HOSPITAL_COMMUNITY)
Admission: EM | Admit: 2024-04-08 | Discharge: 2024-04-09 | Disposition: A | Discharge status: Home / Self Care | Payer: MEDICAID | Attending: Emergency Medicine | Admitting: Emergency Medicine

## 2024-04-08 ENCOUNTER — Encounter (HOSPITAL_COMMUNITY): Payer: Self-pay | Admitting: Emergency Medicine

## 2024-04-08 DIAGNOSIS — F840 Autistic disorder: Secondary | ICD-10-CM | POA: Diagnosis not present

## 2024-04-08 DIAGNOSIS — J45909 Unspecified asthma, uncomplicated: Secondary | ICD-10-CM | POA: Insufficient documentation

## 2024-04-08 DIAGNOSIS — F319 Bipolar disorder, unspecified: Secondary | ICD-10-CM | POA: Diagnosis present

## 2024-04-08 DIAGNOSIS — F99 Mental disorder, not otherwise specified: Secondary | ICD-10-CM

## 2024-04-08 LAB — COMPREHENSIVE METABOLIC PANEL WITH GFR
ALT: 87 U/L — ABNORMAL HIGH (ref 0–44)
AST: 37 U/L (ref 15–41)
Albumin: 4.3 g/dL (ref 3.5–5.0)
Alkaline Phosphatase: 68 U/L (ref 38–126)
Anion gap: 11 (ref 5–15)
BUN: 8 mg/dL (ref 6–20)
CO2: 24 mmol/L (ref 22–32)
Calcium: 9.2 mg/dL (ref 8.9–10.3)
Chloride: 103 mmol/L (ref 98–111)
Creatinine, Ser: 0.96 mg/dL (ref 0.61–1.24)
GFR, Estimated: >60 mL/min (ref >60)
Glucose, Bld: 101 mg/dL — ABNORMAL HIGH (ref 70–99)
Potassium: 3.5 mmol/L (ref 3.5–5.1)
Sodium: 138 mmol/L (ref 135–145)
Total Bilirubin: 1.6 mg/dL — ABNORMAL HIGH (ref 0.0–1.2)
Total Protein: 6.9 g/dL (ref 6.5–8.1)

## 2024-04-08 LAB — CBC WITH DIFFERENTIAL/PLATELET
Abs Immature Granulocytes: 0.01 K/uL (ref 0.00–0.07)
Basophils Absolute: 0 K/uL (ref 0.0–0.1)
Basophils Relative: 1 %
Eosinophils Absolute: 0.2 K/uL (ref 0.0–0.5)
Eosinophils Relative: 4 %
HCT: 39.6 % (ref 39.0–52.0)
Hemoglobin: 13.3 g/dL (ref 13.0–17.0)
Immature Granulocytes: 0 %
Lymphocytes Relative: 25 %
Lymphs Abs: 1.4 K/uL (ref 0.7–4.0)
MCH: 29.8 pg (ref 26.0–34.0)
MCHC: 33.6 g/dL (ref 30.0–36.0)
MCV: 88.8 fL (ref 80.0–100.0)
Monocytes Absolute: 0.4 K/uL (ref 0.1–1.0)
Monocytes Relative: 7 %
Neutro Abs: 3.7 K/uL (ref 1.7–7.7)
Neutrophils Relative %: 63 %
Platelets: 197 K/uL (ref 150–400)
RBC: 4.46 MIL/uL (ref 4.22–5.81)
RDW: 13.1 % (ref 11.5–15.5)
WBC: 5.8 K/uL (ref 4.0–10.5)
nRBC: 0 % (ref 0.0–0.2)

## 2024-04-08 LAB — ACETAMINOPHEN LEVEL: Acetaminophen (Tylenol), Serum: <10 ug/mL — ABNORMAL LOW (ref 10–30)

## 2024-04-08 LAB — SALICYLATE LEVEL: Salicylate Lvl: <7 mg/dL — ABNORMAL LOW (ref 7.0–30.0)

## 2024-04-08 NOTE — ED Notes (Signed)
 Pt has been dressed out and wanded. Belongings have been collected and consist of a pair of jeans, belt, socks, tshirt, and shoes. Pt also has a phone, wallet and charm that has been placed into a biohazard bag and labeled and placed into white belongings bag with other belongings. Pt also has a blue duffle bag that has been labeled. All bags have been placed in the nurses station 9-25.
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
## 2024-04-09 DIAGNOSIS — F319 Bipolar disorder, unspecified: Secondary | ICD-10-CM | POA: Diagnosis present

## 2024-04-09 MED ORDER — OLANZAPINE 2.5 MG PO TABS: 2.5000 mg | ORAL_TABLET | Freq: Every day | ORAL | 0 refills | Status: AC

## 2024-04-09 MED ORDER — GABAPENTIN 300 MG PO CAPS
300.0000 mg | ORAL_CAPSULE | Freq: Three times a day (TID) | ORAL | Status: DC
  Administered 2024-04-09: 300 mg via ORAL
  Filled 2024-04-09: qty 1

## 2024-04-09 MED ORDER — OLANZAPINE 5 MG PO TABS: 7.5000 mg | ORAL_TABLET | Freq: Every day | ORAL | Status: DC

## 2024-04-09 MED ORDER — HYDROXYZINE HCL 25 MG PO TABS: 25.0000 mg | ORAL_TABLET | Freq: Three times a day (TID) | ORAL | Status: DC | PRN

## 2024-04-09 MED ORDER — GABAPENTIN 300 MG PO CAPS: 300.0000 mg | ORAL_CAPSULE | Freq: Three times a day (TID) | ORAL | 0 refills | Status: AC

## 2024-04-09 MED ORDER — OLANZAPINE 2.5 MG PO TABS
2.5000 mg | ORAL_TABLET | Freq: Every day | ORAL | Status: DC
  Administered 2024-04-09: 2.5 mg via ORAL
  Filled 2024-04-09: qty 1

## 2024-04-09 MED ORDER — HYDROXYZINE HCL 25 MG PO TABS: 25.0000 mg | ORAL_TABLET | Freq: Four times a day (QID) | ORAL | 0 refills | Status: AC

## 2024-04-09 MED ORDER — OLANZAPINE 7.5 MG PO TABS: 7.5000 mg | ORAL_TABLET | Freq: Every day | ORAL | 0 refills | Status: AC

## 2024-04-09 NOTE — ED Provider Notes (Signed)
 Emergency Medicine Observation Re-evaluation Note  Adrian Meyer is a 23 y.o. male, seen on rounds today.  Pt initially presented to the ED for complaints of Suicidal Currently, the patient is resting.  Physical Exam  BP 104/65   Pulse 68   Temp 98.1 F (36.7 C)   Resp 19   SpO2 100%  Physical Exam General: nad   ED Course / MDM  EKG:   I have reviewed the labs performed to date as well as medications administered while in observation.  Recent changes in the last 24 hours include evaluated by behavioral health, there are medically clear by prior Dr yesterday, cleared psychiatrically this morning. BH NP ordered rx for pt Plan  Current plan is for discharge.    Elnor Savant A, DO 04/09/24 1101

## 2024-04-09 NOTE — Consult Note (Signed)
 [facility] Psychiatric Consult Follow-up  Patient Name: .Adrian Meyer  MRN: [id number]  DOB: July 25, 2001  Consult Order details:  Orders (From admission, onward)     Start     Ordered   04/08/24 2146  CONSULT TO CALL ACT TEAM       Ordering Provider: Franklyn Sid SAILOR, MD  Provider:  (Not yet assigned)  Question:  Reason for Consult?  Answer:  anxiety/depression   04/08/24 2145             Mode of Visit: In person    Psychiatry Consult Evaluation  Service Date: April 09, 2024 LOS:  LOS: 0 days  Chief Complaint I am running out of my medication and my psychiatrist got fired  Primary Psychiatric Diagnoses  Bipolar 1 disorder  Assessment  Adrian Meyer is a 23 y.o. male admitted: Presented to the [facility] 04/08/2024  7:32 PM for brought in by ambulance from home complaining of suicidal ideation. He carries the psychiatric diagnoses of bipolar 1 disorder, ADHD, MDD, anxiety and autism spectrum disorder and has a past medical history of asthma.   His current presentation of calm cooperation is most consistent with patient's stated goal of needing help with medication refills. He meets criteria for outpatient psychiatric followup based on not being a danger to himself or others.  Current outpatient psychotropic medications include gabapentin , klonopin  and lybalvi and historically he has had a positive response to these medications. He was not compliant with medications prior to admission as evidenced by patient report he was trying to stretch them out. On initial examination, patient is calm, polite and cooperative. Please see plan below for detailed recommendations.   Diagnoses:  Active Hospital problems: Principal Problem:   Bipolar I disorder (HCC)    Plan   ## Psychiatric Medication Recommendations:  --zyprexa  2.5 Q day and 7.5mg  Q HS --gabapentin  300mg  PO TID --hydroxyzine  25mg  PO TID PRN anxiety  ## Medical Decision Making Capacity: Not specifically  addressed in this encounter  ## Further Work-up:  -- most recent EKG on 04/08/2024 had QtC of 466 -- Pertinent labwork reviewed earlier this admission includes: CBC, CMP, acetaminophen , salicylate and alcohol; UA and UDS are still pending   ## Disposition:-- There are no psychiatric contraindications to discharge at this time  ## Behavioral / Environmental: -Utilize compassion and acknowledge the patient's experiences while setting clear and realistic expectations for care.    ## Safety and Observation Level:  - Based on my clinical evaluation, I estimate the patient to be at low risk of self harm in the current setting. - At this time, we recommend  routine. This decision is based on my review of the chart including patient's history and current presentation, interview of the patient, mental status examination, and consideration of suicide risk including evaluating suicidal ideation, plan, intent, suicidal or self-harm behaviors, risk factors, and protective factors. This judgment is based on our ability to directly address suicide risk, implement suicide prevention strategies, and develop a safety plan while the patient is in the clinical setting. Please contact our team if there is a concern that risk level has changed.  CSSR Risk Category:C-SSRS RISK CATEGORY: Moderate Risk  Suicide Risk Assessment: Patient has following modifiable risk factors for suicide: medication noncompliance, which we are addressing by recommending outpatient psychiatric follow up. Patient has following non-modifiable or demographic risk factors for suicide: male gender and psychiatric hospitalization Patient has the following protective factors against suicide: Supportive family, Supportive friends, and Minor  children in the home  Thank you for this consult request. Recommendations have been communicated to the primary team.  We will sign off at this time.   Efrain DELENA Patient, NP       History of Present Illness   Relevant Aspects of Hospital ED Course:  Admitted on 04/08/2024 for brought in by ambulance from home complaining of suicidal ideation. He carries the psychiatric diagnoses of bipolar 1 disorder, ADHD, MDD, anxiety and autism spectrum disorder and has a past medical history of asthma.    Patient Report:   Adrian Meyer, is seen face to face by this provider, consulted with Dr. Larina; and chart reviewed on 04/09/24.  On evaluation Adrian Meyer restates his reason for coming to the emergency department is due to his psychiatrist, Adrian Meyer, being fired and needing to get refills for his medications. He says he was trying to stretch them out but now needs refills.  He reports I said I was suicidal so they would bring me to the hospital.  Patient reports he lives with his wife and child. His mother lives next door. He says he has solid support and good family relationships.  Patient shares he has some stress regarding finances.  He is currently on disability and says his check supports the whole family.  Mom and spouse also do not work.  Patient has a sister and she is his payee. He says he would like to manage his own check.    During evaluation Adrian Meyer is laying in bed in no acute distress.  He is alert & oriented x 4, calm, cooperative and attentive for this assessment.  His mood is euthymic with congruent affect.  He has normal speech, and behavior.  Objectively there is no evidence of psychosis/mania or delusional thinking. Pt does not appear to be responding to internal or external stimuli.  Patient is able to converse coherently, goal directed thoughts, no distractibility, or pre-occupation.  He denies suicidal/self-harm/homicidal ideation, psychosis, and paranoia.  Patient answered questions appropriately.    I personally spent a total of 60 minutes in the care of the patient today including preparing to see the patient, getting/reviewing separately  obtained history, performing a medically appropriate exam/evaluation, counseling and educating, placing orders, referring and communicating with other health care professionals, documenting clinical information in the EHR, independently interpreting results, and communicating results.  Psych ROS:  Depression: endorses Anxiety:  endorses Mania (lifetime and current): denies Psychosis: (lifetime and current): denies  Collateral information:  Contacted Adrian Meyer, wife, at [phone] on 04/09/2024.  Mrs Bless says she was out of town for a couple weeks visiting family.  She confirms patient came to the hospital to get help with his medication refills.  She says that the clinic patient was receiving care from can no longer see him because none of the remaining providers accept medicaid.  Information about the [facility] and the outpatient service was provided.      Review of Systems  Psychiatric/Behavioral:  The patient is nervous/anxious.   All other systems reviewed and are negative.    Psychiatric and Social History  Psychiatric History:  Information collected from patient and chart review  Prev Dx/Sx: bipolar 1 disorder, ADHD, MDD, anxiety and autism spectrum disorder  Current Psych Provider: currently in need of new provider; previous [facility] Meds (current): klonopin , gabapentin , hydroxyzine  and lybalvi Previous Med Trials: depakote Therapy: none noted  Prior Psych Hospitalization:  yes  Prior Self Harm: reports an attempt in 2020 by laying in street Prior Violence: denies  Family Psych History: denies Family Hx suicide: denies  Social History:  Developmental Hx: unknown Educational Hx: completed 8th grade Occupational Hx: Disability Legal Hx: none noted Living Situation: lives with wife and daughter (67 year old) Spiritual Hx: none Access to weapons/lethal means: denies   Substance History Alcohol: denies  Tobacco:  denies Illicit drugs: THC Prescription drug abuse: denies Rehab hx: denies  Exam Findings  Physical Exam:  Vital Signs:  Temp:  [97.9 F (36.6 C)-98.1 F (36.7 C)] 98.1 F (36.7 C) (07/13 0617) Pulse Rate:  [56-68] 68 (07/13 0617) Resp:  [16-19] 19 (07/13 0617) BP: (100-104)/(59-65) 104/65 (07/13 0617) SpO2:  [100 %] 100 % (07/13 0617) Blood pressure 104/65, pulse 68, temperature 98.1 F (36.7 C), resp. rate 19, SpO2 100%. There is no height or weight on file to calculate BMI.  Physical Exam Vitals and nursing note reviewed.  Eyes:     Pupils: Pupils are equal, round, and reactive to light.  Pulmonary:     Effort: Pulmonary effort is normal.  Skin:    General: Skin is dry.  Neurological:     Mental Status: He is alert and oriented to person, place, and time.  Psychiatric:        Attention and Perception: Attention and perception normal.        Mood and Affect: Mood and affect normal.        Speech: Speech normal.        Behavior: Behavior normal. Behavior is cooperative.        Thought Content: Thought content normal.        Cognition and Memory: Cognition is impaired.     Mental Status Exam: General Appearance: Casual  Orientation:  Full (Time, Place, and Person)  Memory:  Immediate;   Good Recent;   Good Remote;   Good  Concentration:  Concentration: Good  Recall:  Good  Attention  Good  Eye Contact:  Good  Speech:  Clear and Coherent  Language:  Fair  Volume:  Normal  Mood: Euthymic  Affect:  Congruent  Thought Process:  Coherent  Thought Content:  Logical  Suicidal Thoughts:  No  Homicidal Thoughts:  No  Judgement:  Intact  Insight:  Fair  Psychomotor Activity:  Normal  Akathisia:  No  Fund of Knowledge:  Fair      Assets:  Communication Skills Desire for Improvement Housing Leisure Time Physical Health Social Support  Cognition:  Impaired,  Mild  ADL's:  Intact  AIMS (if indicated):        Other History   These have been pulled in  through the EMR, reviewed, and updated if appropriate.  Family History:  The patient's family history is not on file.  Medical History: Past Medical History:  Diagnosis Date   ADHD (attention deficit hyperactivity disorder)    Anxiety    Asthma    Depression    Mood disorder [facility] [place])     Surgical History: Past Surgical History:  Procedure Laterality Date   wart removal       Medications:  No current facility-administered medications for this encounter.  Current Outpatient Medications:    clonazePAM  (KLONOPIN ) 0.5 MG tablet, Take 1 tablet (0.5 mg total) by mouth at bedtime as needed. (Patient not taking: Reported on 03/10/2020), Disp: 10 tablet, Rfl: 0   clonazePAM  (KLONOPIN ) 2 MG tablet, Take 2 mg by mouth daily as needed.,  Disp: , Rfl:    gabapentin  (NEURONTIN ) 300 MG capsule, Take 300 mg by mouth 3 (three) times daily., Disp: , Rfl:    hydrOXYzine  (ATARAX /VISTARIL ) 25 MG tablet, Take 1 tablet (25 mg total) by mouth 3 (three) times daily as needed for anxiety. (Patient not taking: Reported on 03/11/2020), Disp: 30 tablet, Rfl: 0   hydrOXYzine  (VISTARIL ) 50 MG capsule, Take 50 mg by mouth 3 (three) times daily as needed., Disp: , Rfl:    OLANZapine  (ZYPREXA ) 2.5 MG tablet, Take 1 tablet (2.5 mg total) by mouth daily. (Patient not taking: Reported on 10/04/2023), Disp: 30 tablet, Rfl: 0   OLANZapine  (ZYPREXA ) 7.5 MG tablet, Take 1 tablet (7.5 mg total) by mouth at bedtime., Disp: 30 tablet, Rfl: 0   OXcarbazepine  (TRILEPTAL ) 150 MG tablet, Take 3 tablets (450 mg total) by mouth 2 (two) times daily. (Patient not taking: Reported on 10/04/2023), Disp: 60 tablet, Rfl: 0   prazosin  (MINIPRESS ) 1 MG capsule, Take 1 capsule (1 mg total) by mouth at bedtime. (Patient not taking: Reported on 03/11/2020), Disp: 30 capsule, Rfl: 0   zolpidem (AMBIEN) 5 MG tablet, Take 5 mg by mouth at bedtime., Disp: , Rfl:   Allergies: Allergies  Allergen Reactions   Depakote [Valproic Acid] Nausea Only    Pt  reports Depakote makes him sick    Efrain DELENA Patient, NP
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
## 2024-04-09 NOTE — Discharge Instructions (Addendum)
 [facility]  [address] [place], [place]  Please arrive at 7:30am Monday morning at the [facility] for a walk in appointment to establish outpatient psychiatric care.  The outpatient offices are located on the second floor.      RESOURCE GUIDE  Chronic Pain Problems: Contact [facility]  [phone] Patients need to be referred by their primary care doctor.  Insufficient Money for Medicine: Contact [employer]:  call [phone]  No Primary Care Doctor: Call [facility]  [phone] - can help you locate a primary care doctor that  accepts your insurance, provides certain services, etc. Physician Referral Service- [phone]  Agencies that provide inexpensive medical care: [facility]  [phone] [facility]  [phone] [facility]  [phone] [facility]  [phone] [facility]  [phone] [facility] Child Meyer  [phone]  Medicaid-accepting [facility] Providers: Adrian Meyer- [address], [address]  [phone], Mon-Fri 9am-7pm, Sat 9am-1pm [facility]- [address] [place], [address] [place]  [phone] [facility]- [address], [address] [place]  [phone] [facility]- [address]  [phone] Kennieth Leech- [address] [place], [address], [phone]  Only accepts [place] patients after they have their name  applied to their card  Self Pay (no insurance) in [facility]: Sickle Cell Patients - [facility]  [address] [place], [phone] [facility]- [address] [place]  [phone]       [facility] [place]- [address]       -     [facility] Meyer- see information above (Speak to [employer] if you do not have insurance)       -  [facility]- [address],  [phone]       -  [facility]- [address] [phone]       -  Dr Catalina-  [address], [place], [phone]       -  Urgent Medical and [facility] - [address], [phone]       -  [facility]- [address], [phone], also [address] [phone]       -     [facility]- [address] [place], [phone], 1st & 3rd Saturday         every month, 10am-1pm  -     [facility] and [facility]   [address] [place], [place].   Phone:  [phone], Fax:  [phone]. Hours of Operation:  9 am - 6 pm, M-F.  -     [facility]   [address], [place]   Phone: [phone], Fax: [phone]. Hours of Operation:  8:30 am - 5:30 pm, M-F.    Dental Assistance If unable to pay or uninsured, contact:  [facility]. to become qualified for the adult dental Meyer.  Patients with Medicaid: [facility] [phone] [address], [phone] [address] [phone]  If unable to pay, or uninsured, contact [facility] [phone] in [place], [phone] in [facility]) to become qualified for the adult dental Meyer  [facility] [address] [place], [place] [address] [phone]  [email or web address]  Other Proofreader Services: [facility]- [address] [place], [place], [place], [address], [phone], Ext. [phone], 2nd and 4th Thursday of the month at 6:30am.  10 clients each day by appointment, can sometimes see walk-in patients if someone does not show for an appointment. [facility]- [address] [place], [place], [address], [phone] [facility] [address] [place], [place], [address], [phone] [facility] Health Department- [phone] [facility] Health Department- [phone] [facility] [phone]
